# Patient Record
Sex: Male | Born: 1955 | Race: White | Hispanic: No | Marital: Married | Smoking: Never smoker
Health system: Southern US, Community
[De-identification: ages and names within clinical notes are randomized; demographics above are authoritative.]

## PROBLEM LIST (undated history)

## (undated) DIAGNOSIS — I10 Essential (primary) hypertension: Secondary | ICD-10-CM

## (undated) DIAGNOSIS — I2510 Atherosclerotic heart disease of native coronary artery without angina pectoris: Secondary | ICD-10-CM

## (undated) DIAGNOSIS — E119 Type 2 diabetes mellitus without complications: Secondary | ICD-10-CM

## (undated) DIAGNOSIS — I4891 Unspecified atrial fibrillation: Secondary | ICD-10-CM

## (undated) HISTORY — DX: Essential (primary) hypertension: I10

## (undated) HISTORY — PX: OTHER SURGICAL HISTORY: SHX169

## (undated) HISTORY — DX: Type 2 diabetes mellitus without complications: E11.9

## (undated) HISTORY — DX: Atherosclerotic heart disease of native coronary artery without angina pectoris: I25.10

## (undated) HISTORY — DX: Unspecified atrial fibrillation: I48.91

---

## 2005-03-20 ENCOUNTER — Emergency Department (HOSPITAL_COMMUNITY): Admission: EM | Admit: 2005-03-20 | Discharge: 2005-03-20 | Payer: Self-pay | Admitting: Emergency Medicine

## 2013-03-19 ENCOUNTER — Ambulatory Visit: Payer: Self-pay | Admitting: Cardiology

## 2013-03-29 ENCOUNTER — Encounter: Payer: Self-pay | Admitting: *Deleted

## 2013-03-29 ENCOUNTER — Encounter: Payer: Self-pay | Admitting: Interventional Cardiology

## 2013-03-29 DIAGNOSIS — E119 Type 2 diabetes mellitus without complications: Secondary | ICD-10-CM | POA: Insufficient documentation

## 2013-03-29 DIAGNOSIS — I2510 Atherosclerotic heart disease of native coronary artery without angina pectoris: Secondary | ICD-10-CM | POA: Insufficient documentation

## 2013-03-29 DIAGNOSIS — I10 Essential (primary) hypertension: Secondary | ICD-10-CM | POA: Insufficient documentation

## 2013-03-29 DIAGNOSIS — I4891 Unspecified atrial fibrillation: Secondary | ICD-10-CM | POA: Insufficient documentation

## 2013-03-30 ENCOUNTER — Encounter: Payer: Self-pay | Admitting: Interventional Cardiology

## 2013-03-30 ENCOUNTER — Ambulatory Visit (INDEPENDENT_AMBULATORY_CARE_PROVIDER_SITE_OTHER): Payer: 59 | Admitting: Interventional Cardiology

## 2013-03-30 ENCOUNTER — Encounter (INDEPENDENT_AMBULATORY_CARE_PROVIDER_SITE_OTHER): Payer: Self-pay

## 2013-03-30 VITALS — BP 134/78 | HR 71 | Ht 76.0 in | Wt 351.0 lb

## 2013-03-30 DIAGNOSIS — I10 Essential (primary) hypertension: Secondary | ICD-10-CM

## 2013-03-30 DIAGNOSIS — E669 Obesity, unspecified: Secondary | ICD-10-CM | POA: Insufficient documentation

## 2013-03-30 DIAGNOSIS — E782 Mixed hyperlipidemia: Secondary | ICD-10-CM | POA: Insufficient documentation

## 2013-03-30 DIAGNOSIS — I2510 Atherosclerotic heart disease of native coronary artery without angina pectoris: Secondary | ICD-10-CM

## 2013-03-30 DIAGNOSIS — I4891 Unspecified atrial fibrillation: Secondary | ICD-10-CM

## 2013-03-30 DIAGNOSIS — I4892 Unspecified atrial flutter: Secondary | ICD-10-CM | POA: Insufficient documentation

## 2013-03-30 NOTE — Progress Notes (Signed)
Patient ID: Stephen Marquez, male   DOB: [birth year], 57 y.o.   MRN: [id number]     Patient ID: Stephen Marquez MRN: [id number] DOB/AGE: Sep 16, 1955 57 y.o.   Referring Physician Dr. Tenny Craw   Reason for Consultation CAD  HPI: 57 y/o with h/o CAD and AFib.  He had CABG x 4 at age 57.  Since then he has had several heart caths and two stents placed.  He had a stent that closed and there were collaterals growing.  He has had AFib /Aflutteras well and as had an ablation.  He had chest pain with rapid heart rates.  He came close to passing out from HR above 200.  That was earlier in 2014.  AFib had started in 2008.    His last cath was 2 years ago and no stents were placed.  Both stents are closed.  2/4 bypasses were open.  SVGs were closed.  Gastroepiploic bypass was apparently open.  No cardiac sx.  Climbing stirs is his most strenuous activity. No cardiac sx with this.  He has no SHOB with multiple flights of stairs.  No recent NTG use.  No nausea, palpitations, syncope left arm pain.  His angina was DOE without pain.  He has never had a heart attack.  No CHF.    Father had CAD at an early age.    Current Outpatient Prescriptions  Medication Sig Dispense Refill  . aspirin 325 MG EC tablet Take 325 mg by mouth daily.      Marland Kitchen atorvastatin (LIPITOR) 20 MG tablet Take 1 tablet by mouth daily.      . clopidogrel (PLAVIX) 75 MG tablet Take 1 tablet by mouth daily.      . Famotidine 20 MG CHEW Chew by mouth 2 (two) times daily.      . Fenofibrate 150 MG CAPS Take 1 tablet by mouth daily.      . folic acid (FOLVITE) 800 MCG tablet Take 400 mcg by mouth daily.      . isosorbide mononitrate (IMDUR) 60 MG 24 hr tablet Take 1 tablet by mouth daily.      Demetra Shiner [employer] (ONE TOUCH DELICA LANCING DEV) MISC As directed      . losartan (COZAAR) 25 MG tablet Take 1 tablet by mouth daily.      . metFORMIN (GLUCOPHAGE) 1000 MG tablet Take 2 tablets by mouth 2 (two) times daily.      . metoprolol  (LOPRESSOR) 100 MG tablet Take 2 tablets by mouth 2 (two) times daily.      . Multiple Vitamins-Minerals (MULTIVITAMIN PO) Take by mouth daily.      . niacin 500 MG tablet Take 500 mg by mouth 3 (three) times daily with meals.      . nitroGLYCERIN (NITROSTAT) 0.4 MG SL tablet Place 0.4 mg under the tongue every 5 (five) minutes as needed for chest pain.      . Omega-3 Fatty Acids (FISH OIL) 1000 MG CAPS Take by mouth daily.      . ONE TOUCH ULTRA TEST test strip As directed      . ONETOUCH DELICA LANCETS FINE MISC As directed      . pioglitazone (ACTOS) 15 MG tablet Take 2 tablets by mouth daily.       No current facility-administered medications for this visit.   Past Medical History  Diagnosis Date  . CAD (coronary artery disease)   . HTN (hypertension)   . Diabetes   . A-fib '  Family History  Problem Relation Age of Onset  . Heart disease Father   . Dementia Mother   . Hypertension Mother     History   Social History  . Marital Status: Married    Spouse Name: N/A    Number of Children: N/A  . Years of Education: N/A   Occupational History  . Not on file.   Social History Main Topics  . Smoking status: Never Smoker   . Smokeless tobacco: Not on file  . Alcohol Use: Not on file  . Drug Use: Not on file  . Sexual Activity: Not on file   Other Topics Concern  . Not on file   Social History Narrative  . No narrative on file    Past Surgical History  Procedure Laterality Date  . Quadruple bypass    . Heart stent    . Heart ablation        (Not in a hospital admission)  Review of systems complete and found to be negative unless listed above .  No nausea, vomiting.  No fever chills, No focal weakness,  No palpitations.  Physical Exam: Filed Vitals:   03/30/13 1540  BP: 134/78  Pulse: 71    Weight: 351 lb (159.213 kg)  Physical exam:  [place]/AT EOMI No JVD, No carotid bruit RRR S1S2  No wheezing Soft. NT, nondistended Bilateral LE edema. No focal  motor or sensory deficits Normal affect  Labs:   No results found for this basename: WBC, HGB, HCT, MCV, PLT   No results found for this basename: NA, K, CL, CO2, BUN, CREATININE, CALCIUM, LABALBU, PROT, BILITOT, ALKPHOS, ALT, AST, GLUCOSE,  in the last 168 hours No results found for this basename: CKTOTAL, CKMB, CKMBINDEX, TROPONINI    No results found for this basename: CHOL   No results found for this basename: HDL   No results found for this basename: LDLCALC   No results found for this basename: TRIG   No results found for this basename: CHOLHDL   No results found for this basename: LDLDIRECT       EKG: NSR, NSST  ASSESSMENT AND PLAN:   CAD: Continue medical therapy. No anginal symptoms at this time. We'll try to get a CD of his most recent catheterization film which was done prior to moving [place]. We discussed switching metoprolol to carvedilol. This is fine with me. He is not sure why Dr. Tenny Craw was interested in this. He like to continue with the metoprolol for now.  Obesity: Stressed the importance of weight loss. Diet has improved.  Try to get to 30 minutes a day, 5 days a week of walking.  HTN: Controlled. Continue current medications.  AFib/AFlutter: Status post ablation. Not on anticoagulation at this time.  Hyperlipidemia: He has had increased TG for some time.  Will check lipids and electrolytes.  Signed:   Fredric Mare, MD, [facility] 03/30/2013, 4:08 PM

## 2013-03-30 NOTE — Patient Instructions (Signed)
Your physician recommends that you return for lab work on 04/02/13 for Cmet and Lipid.  Your physician wants you to follow-up in: 6 months with Dr. Eldridge Dace. You will receive a reminder letter in the mail two months in advance. If you don't receive a letter, please call our office to schedule the follow-up appointment.  We will obtain a CD of your cath films.

## 2013-04-02 ENCOUNTER — Other Ambulatory Visit: Payer: 59

## 2013-04-06 ENCOUNTER — Other Ambulatory Visit (INDEPENDENT_AMBULATORY_CARE_PROVIDER_SITE_OTHER): Payer: 59

## 2013-04-06 DIAGNOSIS — E782 Mixed hyperlipidemia: Secondary | ICD-10-CM

## 2013-04-06 LAB — LDL CHOLESTEROL, DIRECT: Direct LDL: 77 mg/dL

## 2013-04-06 LAB — COMPREHENSIVE METABOLIC PANEL
ALT: 30 U/L (ref 0–53)
AST: 25 U/L (ref 0–37)
Albumin: 4.2 g/dL (ref 3.5–5.2)
Alkaline Phosphatase: 71 U/L (ref 39–117)
BUN: 17 mg/dL (ref 6–23)
CO2: 27 mEq/L (ref 19–32)
Calcium: 9.7 mg/dL (ref 8.4–10.5)
Chloride: 103 mEq/L (ref 96–112)
Creatinine, Ser: 0.8 mg/dL (ref 0.4–1.5)
GFR: 111 mL/min (ref >60.00)
Glucose, Bld: 144 mg/dL — ABNORMAL HIGH (ref 70–99)
Potassium: 4.3 mEq/L (ref 3.5–5.1)
Sodium: 137 mEq/L (ref 135–145)
Total Bilirubin: 0.8 mg/dL (ref 0.3–1.2)
Total Protein: 6.9 g/dL (ref 6.0–8.3)

## 2013-04-06 LAB — LIPID PANEL
Cholesterol: 142 mg/dL (ref 0–200)
HDL: 30.1 mg/dL — ABNORMAL LOW (ref >39.00)
Total CHOL/HDL Ratio: 5
Triglycerides: 335 mg/dL — ABNORMAL HIGH (ref 0.0–149.0)
VLDL: 67 mg/dL — ABNORMAL HIGH (ref 0.0–40.0)

## 2013-04-09 ENCOUNTER — Other Ambulatory Visit: Payer: Self-pay | Admitting: Cardiology

## 2013-04-09 ENCOUNTER — Telehealth: Payer: Self-pay | Admitting: Cardiology

## 2013-04-09 DIAGNOSIS — E782 Mixed hyperlipidemia: Secondary | ICD-10-CM

## 2013-04-09 NOTE — Telephone Encounter (Signed)
Stephen Marquez, can you please look at pts most recent Lipid panel per Dr. Eldridge Dace.

## 2013-04-09 NOTE — Telephone Encounter (Signed)
LDL 77, non-HDL 112, HDL 30, TG 335, glucose 144  RF:  H/o CABG and multiple PCI, HTN, diabetes, age - LDL goal < 70, non-HDL < 100 Meds:  Lipitor 20 mg qd, Fenofibrate 150 mg qd, fish oil 1 g/d, niacin 500 mg - 3 per day. Patient recently moved here from [place]. Patient's LDL, non-HDL, and TG all need further reduction given his past medical history.   Will have patient increase lipitor and fish oil, and also make sure he is taking fibrate with largest meal of the day.  Would like to have NMR checked in 3 months to assess LDL-P number given CAD at young age and diabetes. Plan: 1.  Increase lipitor to 40 mg qd. 2.  Increase fish oil to 4 grams daily. 3.  Make sure he is taking fenofibrate with largest meal of the day which helps make medication work more effectively. 4.  Continue niacin. 5.  Check an NMR LipoProfile and hepatic panel in 3 months.  Have patient follow up with me in Lipid Clinic 2-3 days after blood work to discuss results and any possible changes. Please notify patient, update meds, set up labs, and set up a 30 minute Lipid Clinic appointment a few days later. Thanks.

## 2013-04-10 MED ORDER — FISH OIL 1000 MG PO CAPS: ORAL_CAPSULE | ORAL | Status: DC

## 2013-04-10 MED ORDER — ATORVASTATIN CALCIUM 40 MG PO TABS: ORAL_TABLET | ORAL | Status: DC

## 2013-04-10 NOTE — Telephone Encounter (Signed)
Pts wife notifed. Lab appt made for 07/09/13. To [facility], please make lipid clinc appt 2-3 days after lab appt and call to let pts wife Beth know. Thank you.

## 2013-04-10 NOTE — Telephone Encounter (Signed)
Pts wife notified. Pt is taking Niacin as stated on med list.

## 2013-04-10 NOTE — Telephone Encounter (Signed)
Understood.  Would like him to continue fish oil 4 capsules bid, increase lipitor to 40 mg qd, take fenofibrate with largest meal, and continue niacin as recommended.  Get blood work and keep appointment in March as recommended.  Have patient bring in medications to that appointment if possible.  Thanks.

## 2013-04-10 NOTE — Telephone Encounter (Signed)
Appointment 07/12/13 @ 10am

## 2013-04-10 NOTE — Telephone Encounter (Signed)
When I called ps wife to give her husbands lipid clinic appt she told me he has been taking fish 1,000 mg 4 cap in am and 4 cap in pm. Pt has been doing this for quite a while.

## 2013-05-17 ENCOUNTER — Telehealth: Payer: Self-pay | Admitting: Interventional Cardiology

## 2013-05-17 NOTE — Telephone Encounter (Signed)
Cath Report Rec From [facility] gave to Amy, [place] sent ROI up to  Cath Dept to have Put On CD and it will Get Mailed once CD is received give to Amy

## 2013-07-09 ENCOUNTER — Other Ambulatory Visit (INDEPENDENT_AMBULATORY_CARE_PROVIDER_SITE_OTHER): Payer: 59

## 2013-07-09 DIAGNOSIS — E782 Mixed hyperlipidemia: Secondary | ICD-10-CM

## 2013-07-09 LAB — HEPATIC FUNCTION PANEL
ALT: 27 U/L (ref 0–53)
AST: 22 U/L (ref 0–37)
Albumin: 4.1 g/dL (ref 3.5–5.2)
Alkaline Phosphatase: 73 U/L (ref 39–117)
Bilirubin, Direct: 0.1 mg/dL (ref 0.0–0.3)
Total Bilirubin: 0.8 mg/dL (ref 0.3–1.2)
Total Protein: 6.8 g/dL (ref 6.0–8.3)

## 2013-07-10 LAB — NMR LIPOPROFILE WITH LIPIDS
Cholesterol, Total: 138 mg/dL (ref <200)
HDL Particle Number: 26.9 umol/L — ABNORMAL LOW (ref >=30.5)
HDL Size: 9 nm — ABNORMAL LOW (ref >=9.2)
HDL-C: 27 mg/dL — ABNORMAL LOW (ref >=40)
LDL Particle Number: 980 nmol/L (ref <1000)
LDL Size: 19.5 nm — ABNORMAL LOW (ref >20.5)
LP-IR Score: 59 — ABNORMAL HIGH (ref <=45)
Large HDL-P: <1.3 umol/L — ABNORMAL LOW (ref >=4.8)
Large VLDL-P: 4.4 nmol/L — ABNORMAL HIGH (ref <=2.7)
Small LDL Particle Number: 862 nmol/L — ABNORMAL HIGH (ref <=527)
Triglycerides: 417 mg/dL — ABNORMAL HIGH (ref <150)
VLDL Size: 45.2 nm (ref <=46.6)

## 2013-07-12 ENCOUNTER — Ambulatory Visit: Payer: 59 | Admitting: Pharmacist

## 2013-07-13 ENCOUNTER — Encounter: Payer: Self-pay | Admitting: Cardiology

## 2013-07-13 ENCOUNTER — Telehealth: Payer: Self-pay | Admitting: Cardiology

## 2013-07-13 DIAGNOSIS — E782 Mixed hyperlipidemia: Secondary | ICD-10-CM

## 2013-07-13 MED ORDER — ICOSAPENT ETHYL 1 G PO CAPS: ORAL_CAPSULE | ORAL | Status: DC

## 2013-07-13 NOTE — Telephone Encounter (Signed)
Message copied by Alcario Drought on Fri Jul 13, 2013 11:27 AM ------      Message from: SMART, Maralyn Sago      Created: Wed Jul 11, 2013  5:21 PM       RF:  H/o CABG and multiple PCI, HTN, diabetes, age - LDL goal < 70, non-HDL < 100; LDL-P goal at least < 1000      Meds:  Lipitor 40 mg qd, Fenofibrate 150 mg qd, fish oil 6-8 g/d (OTC), niacin 500 mg - 3 per day.      Patient recently moved here from [place].      Patient had lipid clinic appointment scheduled, but cancelled it due to work schedule.      Despite his TG being elevated, his LDL-P number is actually very good at < 1000 nmol/L.  Would like to see LDL-P < 800 if possible, which will likely happen as his TG are driven down further.      Would like patient to change to a more potent fish oil.  Given his age can actually get prescription Vascepa (prescription fish oil that is more potent at reducing TG and LDL) for pretty cheap with coupon card (as low as $9 per month for most plans with coupon card).      Plan:      1.  D/C over the counter fish oil.      2.  Start Vascepa 1 g capsules - take 4 per day.  Use Vascepa coupon card from sample closet (as low as $9 per month).  This is high dose fish oil.      3.  Continue fenofibrate with largest meal of the day, niacin 500 mg- 3 daily, and lipitor 40 mg qd.      4.  Recheck NMR LipoProfile and hepatic panel in 4 months.  If he would prefer to sit down with me to discuss, you can make an appointment for 2-3 days later in lipid clinic.      Please notify patient, update meds, set up labs, and if he wants, set up an appointment.             ------

## 2013-07-13 NOTE — Telephone Encounter (Signed)
Pt notified, meds updated and sent in. Copay card and copy of labs mailed to pt at his request.

## 2013-07-16 ENCOUNTER — Telehealth: Payer: Self-pay | Admitting: *Deleted

## 2013-07-16 NOTE — Telephone Encounter (Signed)
PA to [employer] for vascepa

## 2013-07-26 ENCOUNTER — Telehealth: Payer: Self-pay | Admitting: Interventional Cardiology

## 2013-07-26 NOTE — Telephone Encounter (Signed)
New message     Stephen Marquez is not covered by ins company.  Is there something else he can get?  Pt got special card but it did not go thru.  Wife called several days ago but got no response.

## 2013-07-26 NOTE — Telephone Encounter (Signed)
Called [facility], VASCEPA was denied due to incomplete information when prior authorization was submitted, an appeal sent in today.

## 2013-07-26 NOTE — Telephone Encounter (Signed)
Spoke with Maudie Mercury in refills.  Patient has [employer]. She will do a prior authorization request for this medication.  Samples of medication at front desk.  Patient's wife informed that she will get a call back with authorization info and she will pick up the samples tomorrow.

## 2013-08-23 ENCOUNTER — Telehealth: Payer: Self-pay | Admitting: *Deleted

## 2013-08-23 NOTE — Telephone Encounter (Signed)
Previously denied PA for patients VASCEPA through [facility], an APPEAL was done and APPROVED through 08/10/2014. ID # [id number]

## 2013-08-27 ENCOUNTER — Other Ambulatory Visit: Payer: Self-pay | Admitting: Interventional Cardiology

## 2013-10-15 ENCOUNTER — Encounter: Payer: Self-pay | Admitting: Interventional Cardiology

## 2013-10-15 ENCOUNTER — Ambulatory Visit (INDEPENDENT_AMBULATORY_CARE_PROVIDER_SITE_OTHER): Payer: 59 | Admitting: Interventional Cardiology

## 2013-10-15 VITALS — BP 146/76 | HR 74 | Ht 76.0 in | Wt 336.0 lb

## 2013-10-15 DIAGNOSIS — E669 Obesity, unspecified: Secondary | ICD-10-CM

## 2013-10-15 DIAGNOSIS — E782 Mixed hyperlipidemia: Secondary | ICD-10-CM

## 2013-10-15 DIAGNOSIS — I2510 Atherosclerotic heart disease of native coronary artery without angina pectoris: Secondary | ICD-10-CM

## 2013-10-15 DIAGNOSIS — I10 Essential (primary) hypertension: Secondary | ICD-10-CM

## 2013-10-15 DIAGNOSIS — I4891 Unspecified atrial fibrillation: Secondary | ICD-10-CM

## 2013-10-15 NOTE — Progress Notes (Signed)
Patient ID: Stephen Marquez, male   DOB: [birth year], 58 y.o.   MRN: [id number] Patient ID: Stephen Marquez, male   DOB: 1955-12-24, 58 y.o.   MRN: [id number]     Patient ID: Stephen Marquez MRN: [id number] DOB/AGE: 07/18/1955 58 y.o.   Referring Physician Dr. Harrington Challenger   Reason for Consultation CAD  HPI: 58 y/o with h/o CAD and AFib.  He had CABG x 4 at age 58.  Since then he has had several heart caths and two stents placed.  He had a stent that closed and there were collaterals growing.  He has had AFib /Aflutteras well and as had an ablation.  He had chest pain with rapid heart rates.  He came close to passing out from HR above 200.  That was earlier in 2014.  AFib had started in 2008.  Ablation for AFib 2012; Atrial flutter ablation in 2104.  His last cath was in 2012 and no stents were placed.  Both stents are closed.  2/4 bypasses were open.  SVGs were closed.  Gastroepiploic bypass was apparently open.  No cardiac sx.  Climbing stairs is his most strenuous activity. No cardiac sx with this.  He has no SHOB with multiple flights of stairs.  No recent NTG use.  No nausea, palpitations, syncope left arm pain.  His angina was DOE without pain.  He has never had a heart attack.  No CHF.    Father had CAD at an early age.   Feels well since the last visit.  Lost 15 lbs since 12/14 through diet.  Getting used to diabetes.  Has not seen a nutritionist recently.   Current Outpatient Prescriptions  Medication Sig Dispense Refill  . aspirin 325 MG EC tablet Take 325 mg by mouth daily.      Marland Kitchen atorvastatin (LIPITOR) 40 MG tablet Take 1 tablet by mouth  daily  90 tablet  0  . Canagliflozin (INVOKANA PO) Take 100 mg by mouth.       . clopidogrel (PLAVIX) 75 MG tablet Take 1 tablet by mouth daily.      . Famotidine 20 MG CHEW Chew by mouth 2 (two) times daily.      . Fenofibrate 150 MG CAPS Take 1 tablet by mouth daily.      Stephen Marquez Ethyl (VASCEPA) 1 G CAPS 2 capsules twice a day   120 capsule  6  . isosorbide mononitrate (IMDUR) 60 MG 24 hr tablet Take 1 tablet by mouth daily.      Stephen Marquez Devices (ONE TOUCH DELICA LANCING DEV) MISC As directed      . losartan (COZAAR) 25 MG tablet Take 1 tablet by mouth daily.      . metFORMIN (GLUCOPHAGE) 1000 MG tablet Take 2 tablets by mouth 2 (two) times daily.      . metoprolol (LOPRESSOR) 100 MG tablet Take 1 tablet by mouth 2 (two) times daily.       . Multiple Vitamins-Minerals (MULTIVITAMIN PO) Take by mouth daily.      . niacin 500 MG tablet Take 500 mg by mouth 3 (three) times daily with meals.      . nitroGLYCERIN (NITROSTAT) 0.4 MG SL tablet Place 0.4 mg under the tongue every 5 (five) minutes as needed for chest pain.      . ONE TOUCH ULTRA TEST test strip As directed      . ONETOUCH DELICA LANCETS FINE MISC As directed      . pioglitazone (  ACTOS) 15 MG tablet Take 1 tablet by mouth daily.        No current facility-administered medications for this visit.   Past Medical History  Diagnosis Date  . CAD (coronary artery disease)   . HTN (hypertension)   . Diabetes   . A-fib '    Family History  Problem Relation Age of Onset  . Heart disease Father   . Dementia Mother   . Hypertension Mother     History   Social History  . Marital Status: Married    Spouse Name: N/A    Number of Children: N/A  . Years of Education: N/A   Occupational History  . Not on file.   Social History Main Topics  . Smoking status: Never Smoker   . Smokeless tobacco: Not on file  . Alcohol Use: Not on file  . Drug Use: Not on file  . Sexual Activity: Not on file   Other Topics Concern  . Not on file   Social History Narrative  . No narrative on file    Past Surgical History  Procedure Laterality Date  . Quadruple bypass    . Heart stent    . Heart ablation        (Not in a hospital admission)  Review of systems complete and found to be negative unless listed above .  No nausea, vomiting.  No fever chills, No  focal weakness,  No palpitations.  Physical Exam: Filed Vitals:   10/15/13 1555  BP: 146/76  Pulse: 74    Weight: 336 lb (152.409 kg)  Physical exam:  [place]/AT EOMI No JVD, No carotid bruit RRR S1S2  No wheezing Soft. NT, nondistended, obese Bilateral LE edema. No focal motor or sensory deficits Normal affect  Labs:   No results found for this basename: WBC,  HGB,  HCT,  MCV,  PLT   No results found for this basename: NA, K, CL, CO2, BUN, CREATININE, CALCIUM, LABALBU, PROT, BILITOT, ALKPHOS, ALT, AST, GLUCOSE,  in the last 168 hours No results found for this basename: CKTOTAL,  CKMB,  CKMBINDEX,  TROPONINI    Lab Results  Component Value Date   CHOL 142 04/06/2013   Lab Results  Component Value Date   HDL 30.10* 04/06/2013   Lab Results  Component Value Date   LDLCALC NOT CALC 07/09/2013   Lab Results  Component Value Date   TRIG 417* 07/09/2013   TRIG 335.0* 04/06/2013   Lab Results  Component Value Date   CHOLHDL 5 04/06/2013   Lab Results  Component Value Date   LDLDIRECT 77.0 04/06/2013       EKG: NSR, NSST  ASSESSMENT AND PLAN:   CAD: Continue medical therapy. No anginal symptoms at this time. We'll try to get a CD of his most recent catheterization film which was done prior to moving [place]. We discussed switching metoprolol to carvedilol. This is fine with me. He is not sure why Dr. Harrington Challenger was interested in this. He like to continue with the metoprolol for now.  Obesity: Stressed the importance of weight loss. Diet has improved.  Try to get to 30 minutes a day, 5 days a week of walking.  Offered meeting with nutritionist.  Would consider referral to diabetes coordinator.  He will check with his PCP .  HTN: Controlled. Continue current medications.  AFib/AFlutter: Status post ablation. Not on anticoagulation at this time.  Hyperlipidemia: He has had increased TG for some time.  Will check lipids  and electrolytes.  Check with PharmD if there are other  options.   Signed:   Mina Marble, MD, [facility] 10/15/2013, 4:08 PM

## 2013-10-15 NOTE — Patient Instructions (Signed)
Your physician recommends that you continue on your current medications as directed. Please refer to the Current Medication list given to you today.  Your physician wants you to follow-up in: 1 year with Dr. Varanasi. You will receive a reminder letter in the mail two months in advance. If you don't receive a letter, please call our office to schedule the follow-up appointment.  

## 2013-12-02 ENCOUNTER — Other Ambulatory Visit: Payer: Self-pay | Admitting: Interventional Cardiology

## 2014-03-18 ENCOUNTER — Other Ambulatory Visit: Payer: Self-pay

## 2014-03-18 DIAGNOSIS — E782 Mixed hyperlipidemia: Secondary | ICD-10-CM

## 2014-03-18 MED ORDER — ICOSAPENT ETHYL 1 G PO CAPS: ORAL_CAPSULE | ORAL | Status: AC

## 2014-05-13 ENCOUNTER — Other Ambulatory Visit: Payer: Self-pay | Admitting: Interventional Cardiology

## 2014-08-13 ENCOUNTER — Other Ambulatory Visit: Payer: Self-pay | Admitting: Interventional Cardiology

## 2014-09-09 ENCOUNTER — Telehealth: Payer: Self-pay | Admitting: Interventional Cardiology

## 2014-09-09 NOTE — Telephone Encounter (Signed)
New Message      Pt's wife called to schedule pt's recall w/ Dr. Irish Lack and wants to know if he is also due for lab work. There are no orders for labs in Epic but she wants to check and make sure. Please call back and advise.

## 2014-09-09 NOTE — Telephone Encounter (Signed)
Spoke with wife and informed her that I would route this information to Dr. Irish Lack for review and advisement on which labs he would like drawn. Wife verbalized understanding.

## 2014-09-10 NOTE — Telephone Encounter (Signed)
He is followed by lipid clinic.  Please check with them. THanks,

## 2014-09-11 NOTE — Telephone Encounter (Signed)
Pt has never been seen in lipid clinic, Ysidro Evert gave recommendations for medication management in December 2014, but pt has not ever been established in lipid clinic.  Please advise if Dr Irish Lack wants pt to be enrolled, but he has not been followed here formally in our clinic.  Gay Filler is out of the office all week, will not return until Tuesday 09/17/14.

## 2014-09-11 NOTE — Telephone Encounter (Signed)
Will forward to Elberta Leatherwood, PharmD for review on which labs to order

## 2014-09-11 NOTE — Telephone Encounter (Signed)
Will forward to Dr. Varanasi for review and advisement.  

## 2014-09-18 NOTE — Telephone Encounter (Signed)
Ok to enroll in lipid clinic

## 2014-09-18 NOTE — Telephone Encounter (Signed)
Spoke with pt's wife and informed her that Dr. Irish Lack would like for her husband to enroll in our lipid clinic. Wife in agreement with this plan.

## 2014-11-05 ENCOUNTER — Ambulatory Visit: Payer: Self-pay | Admitting: Pharmacist

## 2014-11-07 ENCOUNTER — Other Ambulatory Visit: Payer: Self-pay | Admitting: Pharmacist

## 2014-11-07 DIAGNOSIS — E782 Mixed hyperlipidemia: Secondary | ICD-10-CM

## 2014-11-08 ENCOUNTER — Ambulatory Visit: Payer: Self-pay | Admitting: Pharmacist

## 2014-11-11 ENCOUNTER — Other Ambulatory Visit (INDEPENDENT_AMBULATORY_CARE_PROVIDER_SITE_OTHER): Payer: 59 | Admitting: *Deleted

## 2014-11-11 DIAGNOSIS — E782 Mixed hyperlipidemia: Secondary | ICD-10-CM

## 2014-11-11 LAB — HEPATIC FUNCTION PANEL
ALT: 24 U/L (ref 0–53)
AST: 18 U/L (ref 0–37)
Albumin: 4.1 g/dL (ref 3.5–5.2)
Alkaline Phosphatase: 83 U/L (ref 39–117)
BILIRUBIN TOTAL: 0.8 mg/dL (ref 0.2–1.2)
Bilirubin, Direct: 0.2 mg/dL (ref 0.0–0.3)
Total Protein: 6.5 g/dL (ref 6.0–8.3)

## 2014-11-11 LAB — LIPID PANEL
Cholesterol: 143 mg/dL (ref 0–200)
HDL: 32.3 mg/dL — ABNORMAL LOW (ref >39.00)
NonHDL: 110.7
Total CHOL/HDL Ratio: 4
Triglycerides: 214 mg/dL — ABNORMAL HIGH (ref 0.0–149.0)
VLDL: 42.8 mg/dL — ABNORMAL HIGH (ref 0.0–40.0)

## 2014-11-11 LAB — LDL CHOLESTEROL, DIRECT: Direct LDL: 70 mg/dL

## 2014-11-11 NOTE — Addendum Note (Signed)
Addended by: Merel Santoli K on: 11/11/2014 07:33 AM   Modules accepted: Orders  

## 2014-11-12 ENCOUNTER — Encounter: Payer: Self-pay | Admitting: Interventional Cardiology

## 2014-11-12 ENCOUNTER — Ambulatory Visit (INDEPENDENT_AMBULATORY_CARE_PROVIDER_SITE_OTHER): Payer: 59 | Admitting: Interventional Cardiology

## 2014-11-12 VITALS — BP 124/64 | HR 59 | Ht 76.0 in | Wt 333.4 lb

## 2014-11-12 DIAGNOSIS — E669 Obesity, unspecified: Secondary | ICD-10-CM

## 2014-11-12 DIAGNOSIS — E782 Mixed hyperlipidemia: Secondary | ICD-10-CM | POA: Diagnosis not present

## 2014-11-12 DIAGNOSIS — I10 Essential (primary) hypertension: Secondary | ICD-10-CM | POA: Diagnosis not present

## 2014-11-12 DIAGNOSIS — I4891 Unspecified atrial fibrillation: Secondary | ICD-10-CM

## 2014-11-12 DIAGNOSIS — I2510 Atherosclerotic heart disease of native coronary artery without angina pectoris: Secondary | ICD-10-CM

## 2014-11-12 MED ORDER — NITROGLYCERIN 0.4 MG SL SUBL: 0.4000 mg | SUBLINGUAL_TABLET | SUBLINGUAL | Status: DC | PRN

## 2014-11-12 NOTE — Progress Notes (Signed)
Patient ID: Stephen Marquez, male   DOB: [birth year], 59 y.o.   MRN: [id number]     Cardiology Office Note   Date:  11/12/2014   ID:  Stephen Marquez, DOB 25-Apr-1955, MRN [id number]  PCP:   Melinda Crutch, MD    No chief complaint on file.  Follow-up coronary artery disease  Wt Readings from Last 3 Encounters:  11/12/14 333 lb 6.4 oz (151.229 kg)  10/15/13 336 lb (152.409 kg)  03/30/13 351 lb (159.213 kg)       History of Present Illness: Stephen Marquez is a 59 y.o. male  with h/o CAD and AFib. He had CABG x 4 at age 66. Since then he has had several heart caths and two stents placed. He had a stent that closed and there were collaterals growing. He has had AFib /Aflutteras well and as had an ablation. He had chest pain with rapid heart rates. He came close to passing out from HR above 200. That was earlier in 2014. AFib had started in 2008. Ablation for AFib 2012; Atrial flutter ablation in 2014.  His last cath was in 2012 and no stents were placed. Both stents are closed. 2/4 bypasses were open. SVGs were closed. Gastroepiploic bypass was apparently open. No cardiac sx. Climbing stairs is his most strenuous activity. No cardiac sx with this. He has no SHOB with multiple flights of stairs. No recent NTG use. No nausea, palpitations, syncope left arm pain. His angina was DOE without pain. He has never had a heart attack. No CHF.   Since the last visit in 2015, he has increased walking.  He is doing 10K steps per day.  Most of this is done at work.  He ha some heel problems that limit his exercise.     Past Medical History  Diagnosis Date  . CAD (coronary artery disease)   . HTN (hypertension)   . Diabetes   . A-fib '    Past Surgical History  Procedure Laterality Date  . Quadruple bypass    . Heart stent    . Heart ablation       Current Outpatient Prescriptions  Medication Sig Dispense Refill  . aspirin 325 MG EC tablet Take 325 mg by  mouth daily.    Marland Kitchen atorvastatin (LIPITOR) 40 MG tablet Take 1 tablet by mouth  daily 90 tablet 0  . Canagliflozin (INVOKANA PO) Take 100 mg by mouth.     . clopidogrel (PLAVIX) 75 MG tablet Take 1 tablet by mouth daily.    . Famotidine 20 MG CHEW Chew by mouth 2 (two) times daily.    . Fenofibrate 150 MG CAPS Take 1 tablet by mouth daily.    [name] Ethyl (VASCEPA) 1 G CAPS 2 capsules twice a day 120 capsule 6  . isosorbide mononitrate (IMDUR) 60 MG 24 hr tablet Take 1 tablet by mouth daily.    Elmore Guise Devices (ONE TOUCH DELICA LANCING DEV) MISC As directed    . losartan (COZAAR) 25 MG tablet Take 1 tablet by mouth daily.    . metFORMIN (GLUCOPHAGE) 1000 MG tablet Take 2 tablets by mouth 2 (two) times daily.    . metoprolol (LOPRESSOR) 100 MG tablet Take 1 tablet by mouth 2 (two) times daily.     . Multiple Vitamins-Minerals (MULTIVITAMIN PO) Take by mouth daily.    . niacin 500 MG tablet Take 1,500 mg by mouth 2 (two) times daily with a meal.     .  nitroGLYCERIN (NITROSTAT) 0.4 MG SL tablet Place 0.4 mg under the tongue every 5 (five) minutes as needed for chest pain.    . ONE TOUCH ULTRA TEST test strip As directed    . ONETOUCH DELICA LANCETS FINE MISC As directed    . pioglitazone (ACTOS) 15 MG tablet Take 1 tablet by mouth daily.      No current facility-administered medications for this visit.    Allergies:   Penicillins and Sulfur    Social History:  The patient  reports that he has never smoked. He does not have any smokeless tobacco history on file.   Family History:  The patient's family history includes Dementia in his mother; Heart disease in his father; Hypertension in his mother.    ROS:  Please see the history of present illness.   Otherwise, review of systems are positive for heel pain, episode of cellulitis-resolved with Antibiotics.   All other systems are reviewed and negative.    PHYSICAL EXAM: VS:  BP 124/64 mmHg  Pulse 59  Ht 6\' 4"  (1.93 m)  Wt 333 lb  6.4 oz (151.229 kg)  BMI 40.60 kg/m2 , BMI Body mass index is 40.6 kg/(m^2). GEN: Well nourished, well developed, in no acute distress HEENT: normal Neck: no JVD, carotid bruits, or masses Cardiac: RRR; no murmurs, rubs, or gallops,no edema  Respiratory:  clear to auscultation bilaterally, normal work of breathing GI: soft, nontender, nondistended, + BS MS: no deformity or atrophy Skin: warm and dry, no rash, scars on right leg Neuro:  Strength and sensation are intact Psych: euthymic mood, full affect   EKG:   The ekg ordered today demonstrates NSR, NSST   Recent Labs: 11/11/2014: ALT 24   Lipid Panel    Component Value Date/Time   CHOL 143 11/11/2014 0733   CHOL 138 07/09/2013 0837   TRIG 214.0* 11/11/2014 0733   TRIG 417* 07/09/2013 0837   HDL 32.30* 11/11/2014 0733   HDL 27* 07/09/2013 0837   CHOLHDL 4 11/11/2014 0733   VLDL 42.8* 11/11/2014 0733   LDLCALC NOT CALC 07/09/2013 0837   LDLDIRECT 70.0 11/11/2014 0733     Other studies Reviewed: Additional studies/ records that were reviewed today with results demonstrating: prior cath results.   ASSESSMENT AND PLAN:  1. CAD: Decrease aspirin to 81 mg daily.  Refill SL NTG.  Continue medical therapy. No anginal symptoms at this time. 2. Obesity: Stressed the importance of weight loss. Diet has improved. Try to get to 30 minutes a day, 5 days a week of walking. He has maintained a 50 lb weight loss.  He is trying to get to less than 300 lbs. 3. HTN: Controlled.  Continue current meds. 4. AFib/AFlutter: s/p ablations in 2012 and 2014. 5. Hyperlipidemia: Most recent lipid check showed that his LDL is controlled. His triglycerides were significantly improved compared to 2015. He has improved his diet. Continue lifestyle modifications.   Current medicines are reviewed at length with the patient today.  The patient concerns regarding his medicines were addressed.  The following changes have been made:  Refill  nitroglycerin  Labs/ tests ordered today include: Check lipids in one year along with liver function tests  No orders of the defined types were placed in this encounter.    Recommend 150 minutes/week of aerobic exercise Low fat, low carb, high fiber diet recommended  Disposition:   FU in one year   Teresita Madura., MD  11/12/2014 4:59 PM    [facility] [place], [place], [place]  [address] Phone: [phone]; Fax: [phone]

## 2014-11-12 NOTE — Patient Instructions (Signed)
Medication Instructions:  Same-no change  Labwork: Lipids and LFT's in 1 year. Please do not eat or drink after midnight the night before labs are drawn.  Testing/Procedures: None  Follow-Up: Your physician wants you to follow-up in: 1 year. You will receive a reminder letter in the mail two months in advance. If you don't receive a letter, please call our office to schedule the follow-up appointment.

## 2014-12-04 ENCOUNTER — Other Ambulatory Visit: Payer: Self-pay | Admitting: Interventional Cardiology

## 2015-01-05 ENCOUNTER — Other Ambulatory Visit: Payer: Self-pay | Admitting: Interventional Cardiology

## 2015-08-03 ENCOUNTER — Other Ambulatory Visit: Payer: Self-pay | Admitting: Interventional Cardiology

## 2015-10-18 ENCOUNTER — Other Ambulatory Visit: Payer: Self-pay | Admitting: Interventional Cardiology

## 2015-10-20 ENCOUNTER — Other Ambulatory Visit (INDEPENDENT_AMBULATORY_CARE_PROVIDER_SITE_OTHER): Payer: 59 | Admitting: *Deleted

## 2015-10-20 DIAGNOSIS — I2510 Atherosclerotic heart disease of native coronary artery without angina pectoris: Secondary | ICD-10-CM

## 2015-10-20 DIAGNOSIS — I2583 Coronary atherosclerosis due to lipid rich plaque: Secondary | ICD-10-CM

## 2015-10-20 DIAGNOSIS — I483 Typical atrial flutter: Secondary | ICD-10-CM | POA: Diagnosis not present

## 2015-10-20 DIAGNOSIS — E782 Mixed hyperlipidemia: Secondary | ICD-10-CM | POA: Diagnosis not present

## 2015-10-20 DIAGNOSIS — I10 Essential (primary) hypertension: Secondary | ICD-10-CM | POA: Diagnosis not present

## 2015-10-20 LAB — HEPATIC FUNCTION PANEL
ALBUMIN: 4.1 g/dL (ref 3.6–5.1)
ALK PHOS: 81 U/L (ref 40–115)
ALT: 41 U/L (ref 9–46)
AST: 37 U/L — AB (ref 10–35)
Bilirubin, Direct: 0.3 mg/dL — ABNORMAL HIGH (ref <=0.2)
Indirect Bilirubin: 0.8 mg/dL (ref 0.2–1.2)
Total Bilirubin: 1.1 mg/dL (ref 0.2–1.2)
Total Protein: 6 g/dL — ABNORMAL LOW (ref 6.1–8.1)

## 2015-10-20 LAB — LIPID PANEL
Cholesterol: 111 mg/dL — ABNORMAL LOW (ref 125–200)
HDL: 28 mg/dL — AB (ref >=40)
LDL Cholesterol: 43 mg/dL (ref <130)
Total CHOL/HDL Ratio: 4 Ratio (ref <=5.0)
Triglycerides: 201 mg/dL — ABNORMAL HIGH (ref <150)
VLDL: 40 mg/dL — ABNORMAL HIGH (ref <30)

## 2015-10-20 NOTE — Addendum Note (Signed)
Addended by: Eulis Foster on: 10/20/2015 07:40 AM   Modules accepted: Orders

## 2015-11-20 ENCOUNTER — Encounter (INDEPENDENT_AMBULATORY_CARE_PROVIDER_SITE_OTHER): Payer: Self-pay

## 2015-11-20 ENCOUNTER — Ambulatory Visit (INDEPENDENT_AMBULATORY_CARE_PROVIDER_SITE_OTHER): Payer: 59 | Admitting: Interventional Cardiology

## 2015-11-20 ENCOUNTER — Encounter: Payer: Self-pay | Admitting: Interventional Cardiology

## 2015-11-20 VITALS — BP 120/60 | HR 72 | Ht 75.0 in | Wt 297.0 lb

## 2015-11-20 DIAGNOSIS — E785 Hyperlipidemia, unspecified: Secondary | ICD-10-CM

## 2015-11-20 DIAGNOSIS — I2510 Atherosclerotic heart disease of native coronary artery without angina pectoris: Secondary | ICD-10-CM | POA: Diagnosis not present

## 2015-11-20 DIAGNOSIS — I483 Typical atrial flutter: Secondary | ICD-10-CM | POA: Diagnosis not present

## 2015-11-20 DIAGNOSIS — I10 Essential (primary) hypertension: Secondary | ICD-10-CM

## 2015-11-20 DIAGNOSIS — I2583 Coronary atherosclerosis due to lipid rich plaque: Principal | ICD-10-CM

## 2015-11-20 MED ORDER — NITROGLYCERIN 0.4 MG SL SUBL: 0.4000 mg | SUBLINGUAL_TABLET | SUBLINGUAL | 3 refills | Status: AC | PRN

## 2015-11-20 NOTE — Progress Notes (Signed)
Patient ID: Stephen Marquez, male   DOB: 02-25-1956, 60 y.o.   MRN: [id number]     Cardiology Office Note   Date:  11/20/2015   ID:  COLLIER KIRCHBERG, DOB 1955/12/18, MRN [id number]  PCP:   Melinda Crutch, MD    No chief complaint on file.  Follow-up coronary artery disease  Wt Readings from Last 3 Encounters:  11/20/15 297 lb (134.7 kg)  11/12/14 (!) 333 lb 6.4 oz (151.2 kg)  10/15/13 (!) 336 lb (152.4 kg)       History of Present Illness: Stephen Marquez is a 60 y.o. male  with h/o CAD and AFib. He had CABG x 4 at age 46. Since then he has had several Stephen caths and two stents placed. He had a stent that closed and there were collaterals growing. He has had AFib /Aflutteras well and as had an ablation. He had chest pain with rapid Stephen rates. He came close to passing out from HR above 200. That was earlier in 2014. AFib had started in 2008. Ablation for AFib 2012; Atrial flutter ablation in 2014.  His last cath was in 2012 and no stents were placed. Both stents are closed. 2/4 bypasses were open. SVGs were closed. Gastroepiploic bypass was apparently open. No cardiac sx. Climbing stairs is his most strenuous activity. No cardiac sx with this. He has no SHOB with multiple flights of stairs. No recent NTG use. No nausea, palpitations, syncope left arm pain. His angina was DOE without pain. He has never had a Stephen attack. No CHF.   Since the last visit in 2016, he continues walking.  He is doing 10K steps per day most days.  Most of this is done at work.  He has some heel problems that limit his exercise.    He has lost 36 lbs. Since his last visit in 2016. This has been through diet control.  He has not done any more exercise and walking. No symptoms like his prior arrhythmia. No anginal symptoms either. He feels much better since his weight loss. He also notes that his triglycerides are lower now than they have been for years on his most recent  check.    Past Medical History:  Diagnosis Date  . A-fib [facility]) '  . CAD (coronary artery disease)   . Diabetes ([place])   . HTN (hypertension)     Past Surgical History:  Procedure Laterality Date  . Stephen ablation    . Stephen stent    . quadruple bypass       Current Outpatient Prescriptions  Medication Sig Dispense Refill  . aspirin 81 MG tablet Take 81 mg by mouth daily.    Marland Kitchen atorvastatin (LIPITOR) 40 MG tablet Take 1 tablet by mouth  daily 90 tablet 0  . Canagliflozin (INVOKANA PO) Take 100 mg by mouth.     . clopidogrel (PLAVIX) 75 MG tablet Take 1 tablet by mouth daily.    . Famotidine 20 MG CHEW Chew by mouth 2 (two) times daily.    . Fenofibrate 150 MG CAPS Take 1 tablet by mouth daily.    [name] Ethyl (VASCEPA) 1 G CAPS 2 capsules twice a day 120 capsule 6  . isosorbide mononitrate (IMDUR) 60 MG 24 hr tablet Take 1 tablet by mouth daily.    Elmore Guise Devices (ONE TOUCH DELICA LANCING DEV) MISC As directed    . losartan (COZAAR) 25 MG tablet Take 1 tablet by mouth daily.    Marland Kitchen  metFORMIN (GLUCOPHAGE) 1000 MG tablet Take 2 tablets by mouth 2 (two) times daily.    . metoprolol (LOPRESSOR) 100 MG tablet Take 1 tablet by mouth 2 (two) times daily.     . Multiple Vitamins-Minerals (MULTIVITAMIN PO) Take by mouth daily.    . niacin 500 MG tablet Take 1,500 mg by mouth 2 (two) times daily with a meal.     . nitroGLYCERIN (NITROSTAT) 0.4 MG SL tablet Place 0.4 mg under the tongue every 5 (five) minutes as needed for chest pain (3 DOSES MAX).    . ONE TOUCH ULTRA TEST test strip As directed    . ONETOUCH DELICA LANCETS FINE MISC As directed    . pioglitazone (ACTOS) 15 MG tablet Take 1 tablet by mouth daily.      No current facility-administered medications for this visit.     Allergies:   Penicillins and Sulfur    Social History:  The patient  reports that he has never smoked. He does not have any smokeless tobacco history on file.   Family History:  The patient's  family history includes Stephen Marquez; Stephen Marquez; Hypertension in his Marquez.    ROS:  Please see the history of present illness.   Otherwise, review of systems are positive for heel pain, episode of cellulitis-resolved with Antibiotics.   All other systems are reviewed and negative.    PHYSICAL EXAM: VS:  BP 120/60   Pulse 72   Ht 6\' 3"  (1.905 m)   Wt 297 lb (134.7 kg)   BMI 37.12 kg/m  , BMI Body mass index is 37.12 kg/m. GEN: Well nourished, well developed, in no acute distress  HEENT: normal  Neck: no JVD, carotid bruits, or masses Cardiac: RRR; no murmurs, rubs, or gallops,no edema  Respiratory:  clear to auscultation bilaterally, normal work of breathing GI: soft, nontender, nondistended, + BS MS: no deformity or atrophy  Skin: warm and dry, no rash, scars on right leg Neuro:  Strength and sensation are intact Psych: euthymic mood, full affect   EKG:   The ekg ordered today demonstrates NSR, NSST   Recent Labs: 10/20/2015: ALT 41   Lipid Panel    Component Value Date/Time   CHOL 111 (L) 10/20/2015 0740   CHOL 138 07/09/2013 0837   TRIG 201 (H) 10/20/2015 0740   TRIG 417 (H) 07/09/2013 0837   HDL 28 (L) 10/20/2015 0740   HDL 27 (L) 07/09/2013 0837   CHOLHDL 4.0 10/20/2015 0740   VLDL 40 (H) 10/20/2015 0740   LDLCALC 43 10/20/2015 0740   LDLCALC NOT CALC 07/09/2013 0837   LDLDIRECT 70.0 11/11/2014 0733     Other studies Reviewed: Additional studies/ records that were reviewed today with results demonstrating: prior cath results.   ASSESSMENT AND PLAN:  1. CAD: Decreased I told him that we would get aspirin to 81 mg daily.  Refill SL NTG.  Continue medical therapy. No anginal symptoms at this time. 2. Obesity: Stressed the importance of weight loss. Diet has improved. Try to get to 30 minutes a day, 5 days a week of walking. He has maintained an 85 lb weight loss and is less than 300 lbs. 3. HTN: Controlled.  Continue current  meds. 4. AFib/AFlutter: s/p ablations in 2012 and 2014. 5. Hyperlipidemia: Most recent lipid check showed that his LDL is controlled. His triglycerides were significantly improved compared to 2015. He has improved his diet. Continue lifestyle modifications.   Current medicines are reviewed  at length with the patient today.  The patient concerns regarding his medicines were addressed.  The following changes have been made:  Refill nitroglycerin  Labs/ tests ordered today include: Check lipids in one year along with liver function tests  No orders of the defined types were placed in this encounter.   Recommend 150 minutes/week of aerobic exercise Low fat, low carb, high fiber diet recommended  Disposition:   FU in one year   Signed, Larae Grooms, MD  11/20/2015 4:25 PM    [facility] [place], [place], [place]  [address] Phone: [phone]; Fax: [phone]

## 2015-11-20 NOTE — Patient Instructions (Signed)
**Note De-identified Stephen Marquez Obfuscation** Medication Instructions:  Same-no changes  Labwork: None  Testing/Procedures: None  Follow-Up: Your physician wants you to follow-up in: 1 year. You will receive a reminder letter in the mail two months in advance. If you don't receive a letter, please call our office to schedule the follow-up appointment.      If you need a refill on your cardiac medications before your next appointment, please call your pharmacy.   

## 2015-12-10 ENCOUNTER — Other Ambulatory Visit: Payer: Self-pay | Admitting: Gastroenterology

## 2015-12-10 DIAGNOSIS — D696 Thrombocytopenia, unspecified: Secondary | ICD-10-CM

## 2015-12-17 ENCOUNTER — Ambulatory Visit
Admission: RE | Admit: 2015-12-17 | Discharge: 2015-12-17 | Disposition: A | Discharge status: Home / Self Care | Payer: 59 | Source: Ambulatory Visit | Attending: Gastroenterology | Admitting: Gastroenterology

## 2015-12-17 DIAGNOSIS — D696 Thrombocytopenia, unspecified: Secondary | ICD-10-CM

## 2016-01-01 ENCOUNTER — Telehealth: Payer: Self-pay

## 2016-01-01 NOTE — Telephone Encounter (Signed)
**Note De-Identified Rosealyn Little Obfuscation** The pt is scheduled to have an endoscopy on 01/27/16 with Dr Berton Mount at [facility]. They are requesting that the pt stop taking Plavix 5 days prior to procedure. Also, according to our records, the pt is taking Asa 81 mg daily.  Will he need to stop taking prior to procedure as well?   Please advise.

## 2016-01-02 ENCOUNTER — Other Ambulatory Visit: Payer: Self-pay | Admitting: Interventional Cardiology

## 2016-01-02 NOTE — Telephone Encounter (Signed)
OK to stop Plavix 5 days prior to proceudre.  It is up to the GI MD if he has to stop aspirin.  If he does, he can also stop this 5 days prior to procedure.

## 2016-01-05 NOTE — Telephone Encounter (Signed)
**Note De-Identified Stephen Marquez Obfuscation** This message has been faxed back to [facility] at [phone]. I did receive confirmation that the fax was successful.

## 2016-01-13 ENCOUNTER — Other Ambulatory Visit: Payer: Self-pay | Admitting: Interventional Cardiology

## 2016-03-30 ENCOUNTER — Other Ambulatory Visit: Payer: Self-pay | Admitting: Gastroenterology

## 2016-03-30 DIAGNOSIS — K7469 Other cirrhosis of liver: Secondary | ICD-10-CM

## 2016-03-30 DIAGNOSIS — R188 Other ascites: Secondary | ICD-10-CM

## 2016-04-02 ENCOUNTER — Other Ambulatory Visit: Payer: 59

## 2016-04-08 ENCOUNTER — Ambulatory Visit
Admission: RE | Admit: 2016-04-08 | Discharge: 2016-04-08 | Disposition: A | Discharge status: Home / Self Care | Payer: 59 | Source: Ambulatory Visit | Attending: Gastroenterology | Admitting: Gastroenterology

## 2016-04-08 DIAGNOSIS — K7469 Other cirrhosis of liver: Secondary | ICD-10-CM

## 2016-04-08 DIAGNOSIS — R188 Other ascites: Secondary | ICD-10-CM

## 2016-05-04 ENCOUNTER — Other Ambulatory Visit (HOSPITAL_COMMUNITY): Payer: Self-pay | Admitting: Gastroenterology

## 2016-05-04 DIAGNOSIS — R188 Other ascites: Principal | ICD-10-CM

## 2016-05-04 DIAGNOSIS — K7460 Unspecified cirrhosis of liver: Secondary | ICD-10-CM

## 2016-05-06 ENCOUNTER — Ambulatory Visit (HOSPITAL_COMMUNITY): Admission: RE | Admit: 2016-05-06 | Payer: 59 | Source: Ambulatory Visit

## 2016-05-07 ENCOUNTER — Telehealth: Payer: Self-pay | Admitting: Interventional Cardiology

## 2016-05-07 NOTE — Telephone Encounter (Signed)
No further cardiac testing needed before paracentesis.  OK to hold Plavix 5 days prior to procedure.  Resume plavix post procedure.

## 2016-05-07 NOTE — Telephone Encounter (Signed)
Spoke with operator at [employer]  She will send message to [facility] to let her know that we did receive clearance and that I am sending message to Dr. Irish Lack for review and advisement.    Request for surgical clearance:  1. What type of surgery is being performed? Paracentesis   2. When is this surgery scheduled? 05/11/16   3. Are there any medications that need to be held prior to surgery and how long? Plavix   4. Name of physician performing surgery? Dr. Alessandra Bevels   5. What is your office phone and fax number? Fax [phone]  Ph [phone]

## 2016-05-07 NOTE — Telephone Encounter (Signed)
Faxed to requesting office. 

## 2016-05-07 NOTE — Telephone Encounter (Signed)
Follow Up:    She wants to know if the surgical clearance she faxed over on 05-04-16 was received? If so,she needs this back asap. Pt is scheduled for surgery  this Tuesday(05-11-16.

## 2016-05-11 ENCOUNTER — Ambulatory Visit (HOSPITAL_COMMUNITY)
Admission: RE | Admit: 2016-05-11 | Discharge: 2016-05-11 | Disposition: A | Discharge status: Home / Self Care | Payer: 59 | Source: Ambulatory Visit | Attending: Gastroenterology | Admitting: Gastroenterology

## 2016-05-11 DIAGNOSIS — R188 Other ascites: Secondary | ICD-10-CM | POA: Insufficient documentation

## 2016-05-11 DIAGNOSIS — K7460 Unspecified cirrhosis of liver: Secondary | ICD-10-CM | POA: Diagnosis not present

## 2016-05-11 LAB — GRAM STAIN

## 2016-05-11 MED ORDER — ALBUMIN HUMAN 25 % IV SOLN
50.0000 g | Freq: Once | INTRAVENOUS | Status: AC
  Administered 2016-05-11: 50 g via INTRAVENOUS
  Filled 2016-05-11: qty 200

## 2016-05-11 MED ORDER — LIDOCAINE HCL 1 % IJ SOLN
INTRAMUSCULAR | Status: AC
  Filled 2016-05-11: qty 20

## 2016-05-11 NOTE — Procedures (Signed)
Ultrasound-guided diagnostic and therapeutic paracentesis performed yielding 5 liters  (which was his max) of yellow colored fluid. No immediate complications.  Tai Syfert E 12:10 PM 05/11/2016

## 2016-05-16 LAB — CULTURE, BODY FLUID W GRAM STAIN -BOTTLE: Culture: NO GROWTH

## 2016-05-18 ENCOUNTER — Encounter (HOSPITAL_COMMUNITY): Payer: Self-pay

## 2016-05-18 ENCOUNTER — Ambulatory Visit (HOSPITAL_COMMUNITY)
Admission: RE | Admit: 2016-05-18 | Discharge: 2016-05-18 | Disposition: A | Discharge status: Home / Self Care | Payer: 59 | Source: Ambulatory Visit | Attending: Gastroenterology | Admitting: Gastroenterology

## 2016-05-18 DIAGNOSIS — J90 Pleural effusion, not elsewhere classified: Secondary | ICD-10-CM | POA: Insufficient documentation

## 2016-05-18 DIAGNOSIS — K7460 Unspecified cirrhosis of liver: Secondary | ICD-10-CM | POA: Insufficient documentation

## 2016-05-18 DIAGNOSIS — K766 Portal hypertension: Secondary | ICD-10-CM | POA: Diagnosis not present

## 2016-05-18 DIAGNOSIS — I8510 Secondary esophageal varices without bleeding: Secondary | ICD-10-CM | POA: Insufficient documentation

## 2016-05-18 DIAGNOSIS — R161 Splenomegaly, not elsewhere classified: Secondary | ICD-10-CM | POA: Diagnosis not present

## 2016-05-18 DIAGNOSIS — R188 Other ascites: Secondary | ICD-10-CM | POA: Insufficient documentation

## 2016-05-18 LAB — POCT I-STAT CREATININE: CREATININE: 0.5 mg/dL — AB (ref 0.61–1.24)

## 2016-05-18 MED ORDER — IOPAMIDOL (ISOVUE-300) INJECTION 61%
INTRAVENOUS | Status: DC
Start: 2016-05-18 — End: 2016-05-19
  Filled 2016-05-18: qty 100

## 2016-05-18 MED ORDER — IOPAMIDOL (ISOVUE-300) INJECTION 61%
100.0000 mL | Freq: Once | INTRAVENOUS | Status: AC | PRN
  Administered 2016-05-18: 100 mL via INTRAVENOUS

## 2016-05-18 MED ORDER — SODIUM CHLORIDE 0.9 % IJ SOLN
INTRAMUSCULAR | Status: DC
Start: 2016-05-18 — End: 2016-05-19
  Filled 2016-05-18: qty 50

## 2016-06-02 ENCOUNTER — Other Ambulatory Visit (HOSPITAL_COMMUNITY): Payer: Self-pay | Admitting: Gastroenterology

## 2016-06-02 DIAGNOSIS — K7460 Unspecified cirrhosis of liver: Secondary | ICD-10-CM

## 2016-06-02 DIAGNOSIS — R188 Other ascites: Principal | ICD-10-CM

## 2016-06-07 ENCOUNTER — Ambulatory Visit (HOSPITAL_COMMUNITY)
Admission: RE | Admit: 2016-06-07 | Discharge: 2016-06-07 | Disposition: A | Discharge status: Home / Self Care | Payer: 59 | Source: Ambulatory Visit | Attending: Gastroenterology | Admitting: Gastroenterology

## 2016-06-07 DIAGNOSIS — K7460 Unspecified cirrhosis of liver: Secondary | ICD-10-CM | POA: Insufficient documentation

## 2016-06-07 DIAGNOSIS — R531 Weakness: Secondary | ICD-10-CM | POA: Diagnosis not present

## 2016-06-07 DIAGNOSIS — K759 Inflammatory liver disease, unspecified: Secondary | ICD-10-CM | POA: Insufficient documentation

## 2016-06-07 DIAGNOSIS — A419 Sepsis, unspecified organism: Secondary | ICD-10-CM | POA: Diagnosis not present

## 2016-06-07 DIAGNOSIS — R188 Other ascites: Secondary | ICD-10-CM | POA: Insufficient documentation

## 2016-06-07 LAB — PROTEIN, PLEURAL OR PERITONEAL FLUID

## 2016-06-07 LAB — BODY FLUID CELL COUNT WITH DIFFERENTIAL
Eos, Fluid: 0 %
Lymphs, Fluid: 3 %
MONOCYTE-MACROPHAGE-SEROUS FLUID: 4 % — AB (ref 50–90)
Neutrophil Count, Fluid: 93 % — ABNORMAL HIGH (ref 0–25)
Total Nucleated Cell Count, Fluid: 4725 cu mm — ABNORMAL HIGH (ref 0–1000)

## 2016-06-07 LAB — ALBUMIN, PLEURAL OR PERITONEAL FLUID: ALBUMIN FL: 1 g/dL

## 2016-06-07 LAB — GRAM STAIN

## 2016-06-07 MED ORDER — ALBUMIN HUMAN 25 % IV SOLN
62.5000 g | Freq: Once | INTRAVENOUS | Status: AC
  Administered 2016-06-07: 62.5 g via INTRAVENOUS
  Filled 2016-06-07: qty 300

## 2016-06-07 MED ORDER — ALBUMIN HUMAN 25 % IV SOLN
INTRAVENOUS | Status: AC
  Filled 2016-06-07: qty 200

## 2016-06-07 MED ORDER — LIDOCAINE HCL 1 % IJ SOLN
INTRAMUSCULAR | Status: AC
  Filled 2016-06-07: qty 20

## 2016-06-07 MED ORDER — ALBUMIN HUMAN 25 % IV SOLN
INTRAVENOUS | Status: AC
  Filled 2016-06-07: qty 50

## 2016-06-07 NOTE — Procedures (Signed)
PROCEDURE SUMMARY:  Successful US guided paracentesis from right lateral abdomen.  Yielded 12 liters of clear dark gold fluid.  No immediate complications.  Pt tolerated well.   Specimen was sent for labs.  Corah Willeford S Yordy Matton PA-C 06/07/2016 11:30 AM

## 2016-06-08 ENCOUNTER — Ambulatory Visit: Payer: 59 | Admitting: Hematology & Oncology

## 2016-06-08 ENCOUNTER — Inpatient Hospital Stay (HOSPITAL_COMMUNITY)
Admission: EM | Admit: 2016-06-08 | Discharge: 2016-06-17 | DRG: 871 | Disposition: E | Payer: 59 | Attending: Internal Medicine | Admitting: Internal Medicine

## 2016-06-08 ENCOUNTER — Encounter (HOSPITAL_COMMUNITY): Payer: Self-pay | Admitting: Emergency Medicine

## 2016-06-08 ENCOUNTER — Other Ambulatory Visit: Payer: 59

## 2016-06-08 ENCOUNTER — Ambulatory Visit: Payer: 59

## 2016-06-08 ENCOUNTER — Encounter (HOSPITAL_COMMUNITY): Admission: EM | Disposition: E | Payer: Self-pay | Source: Home / Self Care | Attending: Internal Medicine

## 2016-06-08 ENCOUNTER — Emergency Department (HOSPITAL_COMMUNITY): Payer: 59

## 2016-06-08 DIAGNOSIS — R9431 Abnormal electrocardiogram [ECG] [EKG]: Secondary | ICD-10-CM

## 2016-06-08 DIAGNOSIS — Z7902 Long term (current) use of antithrombotics/antiplatelets: Secondary | ICD-10-CM

## 2016-06-08 DIAGNOSIS — A419 Sepsis, unspecified organism: Principal | ICD-10-CM | POA: Diagnosis present

## 2016-06-08 DIAGNOSIS — K652 Spontaneous bacterial peritonitis: Secondary | ICD-10-CM

## 2016-06-08 DIAGNOSIS — I2111 ST elevation (STEMI) myocardial infarction involving right coronary artery: Secondary | ICD-10-CM | POA: Diagnosis present

## 2016-06-08 DIAGNOSIS — I10 Essential (primary) hypertension: Secondary | ICD-10-CM | POA: Diagnosis present

## 2016-06-08 DIAGNOSIS — K7581 Nonalcoholic steatohepatitis (NASH): Secondary | ICD-10-CM | POA: Diagnosis present

## 2016-06-08 DIAGNOSIS — G9349 Other encephalopathy: Secondary | ICD-10-CM | POA: Diagnosis present

## 2016-06-08 DIAGNOSIS — R531 Weakness: Secondary | ICD-10-CM | POA: Diagnosis present

## 2016-06-08 DIAGNOSIS — C220 Liver cell carcinoma: Secondary | ICD-10-CM | POA: Diagnosis not present

## 2016-06-08 DIAGNOSIS — K766 Portal hypertension: Secondary | ICD-10-CM | POA: Diagnosis present

## 2016-06-08 DIAGNOSIS — C221 Intrahepatic bile duct carcinoma: Secondary | ICD-10-CM

## 2016-06-08 DIAGNOSIS — Z951 Presence of aortocoronary bypass graft: Secondary | ICD-10-CM | POA: Diagnosis not present

## 2016-06-08 DIAGNOSIS — N179 Acute kidney failure, unspecified: Secondary | ICD-10-CM | POA: Diagnosis present

## 2016-06-08 DIAGNOSIS — J9600 Acute respiratory failure, unspecified whether with hypoxia or hypercapnia: Secondary | ICD-10-CM | POA: Diagnosis not present

## 2016-06-08 DIAGNOSIS — I4891 Unspecified atrial fibrillation: Secondary | ICD-10-CM | POA: Diagnosis present

## 2016-06-08 DIAGNOSIS — C787 Secondary malignant neoplasm of liver and intrahepatic bile duct: Secondary | ICD-10-CM | POA: Diagnosis present

## 2016-06-08 DIAGNOSIS — R6521 Severe sepsis with septic shock: Secondary | ICD-10-CM | POA: Diagnosis present

## 2016-06-08 DIAGNOSIS — K7460 Unspecified cirrhosis of liver: Secondary | ICD-10-CM | POA: Diagnosis not present

## 2016-06-08 DIAGNOSIS — Z955 Presence of coronary angioplasty implant and graft: Secondary | ICD-10-CM

## 2016-06-08 DIAGNOSIS — Z820 Family history of epilepsy and other diseases of the nervous system: Secondary | ICD-10-CM

## 2016-06-08 DIAGNOSIS — I472 Ventricular tachycardia, unspecified: Secondary | ICD-10-CM

## 2016-06-08 DIAGNOSIS — I480 Paroxysmal atrial fibrillation: Secondary | ICD-10-CM | POA: Diagnosis not present

## 2016-06-08 DIAGNOSIS — E119 Type 2 diabetes mellitus without complications: Secondary | ICD-10-CM | POA: Diagnosis present

## 2016-06-08 DIAGNOSIS — I959 Hypotension, unspecified: Secondary | ICD-10-CM | POA: Diagnosis not present

## 2016-06-08 DIAGNOSIS — K7290 Hepatic failure, unspecified without coma: Secondary | ICD-10-CM | POA: Diagnosis present

## 2016-06-08 DIAGNOSIS — I2119 ST elevation (STEMI) myocardial infarction involving other coronary artery of inferior wall: Secondary | ICD-10-CM

## 2016-06-08 DIAGNOSIS — R64 Cachexia: Secondary | ICD-10-CM | POA: Diagnosis present

## 2016-06-08 DIAGNOSIS — K7031 Alcoholic cirrhosis of liver with ascites: Secondary | ICD-10-CM | POA: Diagnosis not present

## 2016-06-08 DIAGNOSIS — I2510 Atherosclerotic heart disease of native coronary artery without angina pectoris: Secondary | ICD-10-CM | POA: Diagnosis present

## 2016-06-08 DIAGNOSIS — Z6836 Body mass index (BMI) 36.0-36.9, adult: Secondary | ICD-10-CM

## 2016-06-08 DIAGNOSIS — I8500 Esophageal varices without bleeding: Secondary | ICD-10-CM | POA: Diagnosis present

## 2016-06-08 DIAGNOSIS — R161 Splenomegaly, not elsewhere classified: Secondary | ICD-10-CM

## 2016-06-08 DIAGNOSIS — Z66 Do not resuscitate: Secondary | ICD-10-CM | POA: Diagnosis not present

## 2016-06-08 DIAGNOSIS — R188 Other ascites: Secondary | ICD-10-CM | POA: Diagnosis present

## 2016-06-08 DIAGNOSIS — Z7982 Long term (current) use of aspirin: Secondary | ICD-10-CM

## 2016-06-08 DIAGNOSIS — I482 Chronic atrial fibrillation: Secondary | ICD-10-CM | POA: Diagnosis present

## 2016-06-08 DIAGNOSIS — E861 Hypovolemia: Secondary | ICD-10-CM | POA: Diagnosis present

## 2016-06-08 DIAGNOSIS — I8510 Secondary esophageal varices without bleeding: Secondary | ICD-10-CM

## 2016-06-08 DIAGNOSIS — E871 Hypo-osmolality and hyponatremia: Secondary | ICD-10-CM | POA: Diagnosis not present

## 2016-06-08 DIAGNOSIS — D689 Coagulation defect, unspecified: Secondary | ICD-10-CM | POA: Diagnosis present

## 2016-06-08 DIAGNOSIS — Z880 Allergy status to penicillin: Secondary | ICD-10-CM

## 2016-06-08 DIAGNOSIS — K767 Hepatorenal syndrome: Secondary | ICD-10-CM | POA: Diagnosis present

## 2016-06-08 DIAGNOSIS — Z7984 Long term (current) use of oral hypoglycemic drugs: Secondary | ICD-10-CM

## 2016-06-08 DIAGNOSIS — Z515 Encounter for palliative care: Secondary | ICD-10-CM | POA: Diagnosis not present

## 2016-06-08 DIAGNOSIS — Z8249 Family history of ischemic heart disease and other diseases of the circulatory system: Secondary | ICD-10-CM

## 2016-06-08 DIAGNOSIS — Z882 Allergy status to sulfonamides status: Secondary | ICD-10-CM

## 2016-06-08 DIAGNOSIS — K769 Liver disease, unspecified: Secondary | ICD-10-CM | POA: Diagnosis not present

## 2016-06-08 LAB — DIFFERENTIAL
Basophils Absolute: 0 10*3/uL (ref 0.0–0.1)
Basophils Relative: 0 %
EOS PCT: 0 %
Eosinophils Absolute: 0 10*3/uL (ref 0.0–0.7)
LYMPHS PCT: 9 %
Lymphs Abs: 0.9 10*3/uL (ref 0.7–4.0)
MONO ABS: 1.2 10*3/uL — AB (ref 0.1–1.0)
MONOS PCT: 12 %
NEUTROS ABS: 7.8 10*3/uL — AB (ref 1.7–7.7)
Neutrophils Relative %: 79 %

## 2016-06-08 LAB — TROPONIN I
TROPONIN I: 0.77 ng/mL — AB (ref <0.03)
TROPONIN I: 10.34 ng/mL — AB (ref <0.03)
Troponin I: 0.06 ng/mL (ref <0.03)

## 2016-06-08 LAB — COMPREHENSIVE METABOLIC PANEL
ALBUMIN: 2.5 g/dL — AB (ref 3.5–5.0)
ALK PHOS: 60 U/L (ref 38–126)
ALT: 29 U/L (ref 17–63)
AST: 97 U/L — AB (ref 15–41)
Anion gap: 16 — ABNORMAL HIGH (ref 5–15)
BILIRUBIN TOTAL: 4.5 mg/dL — AB (ref 0.3–1.2)
BUN: 62 mg/dL — AB (ref 6–20)
CALCIUM: 12 mg/dL — AB (ref 8.9–10.3)
CO2: 21 mmol/L — AB (ref 22–32)
CREATININE: 2.05 mg/dL — AB (ref 0.61–1.24)
Chloride: 93 mmol/L — ABNORMAL LOW (ref 101–111)
GFR calc Af Amer: 39 mL/min — ABNORMAL LOW (ref >60)
GFR calc non Af Amer: 34 mL/min — ABNORMAL LOW (ref >60)
GLUCOSE: 211 mg/dL — AB (ref 65–99)
Potassium: 4.7 mmol/L (ref 3.5–5.1)
Sodium: 130 mmol/L — ABNORMAL LOW (ref 135–145)
Total Protein: 4.7 g/dL — ABNORMAL LOW (ref 6.5–8.1)

## 2016-06-08 LAB — GLUCOSE, CAPILLARY
GLUCOSE-CAPILLARY: 184 mg/dL — AB (ref 65–99)
Glucose-Capillary: 180 mg/dL — ABNORMAL HIGH (ref 65–99)
Glucose-Capillary: 172 mg/dL — ABNORMAL HIGH (ref 65–99)

## 2016-06-08 LAB — AMMONIA: AMMONIA: 21 umol/L (ref 9–35)

## 2016-06-08 LAB — CBC
HEMATOCRIT: 49.7 % (ref 39.0–52.0)
Hemoglobin: 16.9 g/dL (ref 13.0–17.0)
MCH: 30.3 pg (ref 26.0–34.0)
MCHC: 34 g/dL (ref 30.0–36.0)
MCV: 89.1 fL (ref 78.0–100.0)
Platelets: 246 10*3/uL (ref 150–400)
RBC: 5.58 MIL/uL (ref 4.22–5.81)
RDW: 15.6 % — AB (ref 11.5–15.5)
WBC: 9.9 10*3/uL (ref 4.0–10.5)

## 2016-06-08 LAB — LACTIC ACID, PLASMA
LACTIC ACID, VENOUS: 4.9 mmol/L — AB (ref 0.5–1.9)
LACTIC ACID, VENOUS: 4.4 mmol/L — AB (ref 0.5–1.9)

## 2016-06-08 LAB — LIPID PANEL
Cholesterol: 55 mg/dL (ref 0–200)
HDL: 17 mg/dL — ABNORMAL LOW (ref >40)
LDL CALC: 24 mg/dL (ref 0–99)
TRIGLYCERIDES: 68 mg/dL (ref <150)
Total CHOL/HDL Ratio: 3.2 RATIO
VLDL: 14 mg/dL (ref 0–40)

## 2016-06-08 LAB — TYPE AND SCREEN
ABO/RH(D): O NEG
Antibody Screen: NEGATIVE

## 2016-06-08 LAB — HEPARIN LEVEL (UNFRACTIONATED)

## 2016-06-08 LAB — APTT: aPTT: 35 seconds (ref 24–36)

## 2016-06-08 LAB — PROTIME-INR
INR: 1.91
Prothrombin Time: 22.1 seconds — ABNORMAL HIGH (ref 11.4–15.2)

## 2016-06-08 LAB — PROCALCITONIN: Procalcitonin: 16.09 ng/mL

## 2016-06-08 LAB — ABO/RH: ABO/RH(D): O NEG

## 2016-06-08 LAB — MRSA PCR SCREENING: MRSA BY PCR: NEGATIVE

## 2016-06-08 LAB — CORTISOL: Cortisol, Plasma: >100 ug/dL

## 2016-06-08 LAB — PSA: PSA: 2.61 ng/mL (ref 0.00–4.00)

## 2016-06-08 SURGERY — LEFT HEART CATH AND CORONARY ANGIOGRAPHY

## 2016-06-08 MED ORDER — SODIUM CHLORIDE 0.9 % IV SOLN
0.0000 ug/min | INTRAVENOUS | Status: DC
  Administered 2016-06-08: 130 ug/min via INTRAVENOUS
  Administered 2016-06-08: 135 ug/min via INTRAVENOUS
  Administered 2016-06-08: 140 ug/min via INTRAVENOUS
  Administered 2016-06-09: 100 ug/min via INTRAVENOUS
  Administered 2016-06-09: 135 ug/min via INTRAVENOUS
  Administered 2016-06-09: 100 ug/min via INTRAVENOUS
  Administered 2016-06-10: 150 ug/min via INTRAVENOUS
  Filled 2016-06-08 (×10): qty 4

## 2016-06-08 MED ORDER — SODIUM CHLORIDE 0.9 % IV BOLUS (SEPSIS)
1000.0000 mL | Freq: Once | INTRAVENOUS | Status: AC
  Administered 2016-06-08: 1000 mL via INTRAVENOUS

## 2016-06-08 MED ORDER — PANTOPRAZOLE SODIUM 40 MG IV SOLR
40.0000 mg | Freq: Every day | INTRAVENOUS | Status: DC
  Administered 2016-06-08: 40 mg via INTRAVENOUS
  Filled 2016-06-08: qty 40

## 2016-06-08 MED ORDER — HEPARIN SODIUM (PORCINE) 5000 UNIT/ML IJ SOLN: 4000.0000 [IU] | Freq: Once | INTRAMUSCULAR | Status: DC

## 2016-06-08 MED ORDER — VANCOMYCIN HCL 10 G IV SOLR
1750.0000 mg | INTRAVENOUS | Status: DC
  Filled 2016-06-08: qty 1750

## 2016-06-08 MED ORDER — METRONIDAZOLE 500 MG PO TABS
500.0000 mg | ORAL_TABLET | Freq: Once | ORAL | Status: AC
  Administered 2016-06-08: 500 mg via ORAL
  Filled 2016-06-08: qty 1

## 2016-06-08 MED ORDER — ORAL CARE MOUTH RINSE
15.0000 mL | Freq: Two times a day (BID) | OROMUCOSAL | Status: DC
  Administered 2016-06-09: 15 mL via OROMUCOSAL

## 2016-06-08 MED ORDER — SODIUM CHLORIDE 0.9 % IV BOLUS (SEPSIS)
2000.0000 mL | Freq: Once | INTRAVENOUS | Status: AC
  Administered 2016-06-08: 2000 mL via INTRAVENOUS

## 2016-06-08 MED ORDER — VANCOMYCIN HCL 10 G IV SOLR
2500.0000 mg | Freq: Once | INTRAVENOUS | Status: AC
  Administered 2016-06-08: 2500 mg via INTRAVENOUS
  Filled 2016-06-08: qty 2500

## 2016-06-08 MED ORDER — HEPARIN (PORCINE) IN NACL 100-0.45 UNIT/ML-% IJ SOLN
2600.0000 [IU]/h | INTRAMUSCULAR | Status: DC
  Administered 2016-06-08: 1700 [IU]/h via INTRAVENOUS
  Administered 2016-06-09 – 2016-06-10 (×2): 2350 [IU]/h via INTRAVENOUS
  Filled 2016-06-08 (×3): qty 250

## 2016-06-08 MED ORDER — LACTULOSE 10 GM/15ML PO SOLN
20.0000 g | Freq: Two times a day (BID) | ORAL | Status: DC
  Administered 2016-06-08 – 2016-06-09 (×3): 20 g via ORAL
  Filled 2016-06-08 (×3): qty 30

## 2016-06-08 MED ORDER — ALBUMIN HUMAN 25 % IV SOLN
100.0000 g | Freq: Every day | INTRAVENOUS | Status: DC
  Administered 2016-06-08 – 2016-06-09 (×2): 100 g via INTRAVENOUS
  Filled 2016-06-08 (×4): qty 400
  Filled 2016-06-08: qty 50
  Filled 2016-06-08: qty 300
  Filled 2016-06-08: qty 50
  Filled 2016-06-08: qty 400

## 2016-06-08 MED ORDER — CHLORHEXIDINE GLUCONATE CLOTH 2 % EX PADS
6.0000 | MEDICATED_PAD | Freq: Every day | CUTANEOUS | Status: DC
  Administered 2016-06-09: 6 via TOPICAL

## 2016-06-08 MED ORDER — ALBUMIN NICU 25% IV SOLUTION: 1.0000 mL/kg | Freq: Once | INTRAVENOUS | Status: DC

## 2016-06-08 MED ORDER — NOREPINEPHRINE BITARTRATE 1 MG/ML IV SOLN
0.0000 ug/min | INTRAVENOUS | Status: AC
  Administered 2016-06-08: 2 ug/min via INTRAVENOUS
  Filled 2016-06-08: qty 4

## 2016-06-08 MED ORDER — SODIUM CHLORIDE 0.9 % IV SOLN: 250.0000 mL | INTRAVENOUS | Status: DC | PRN

## 2016-06-08 MED ORDER — AMIODARONE HCL IN DEXTROSE 360-4.14 MG/200ML-% IV SOLN
60.0000 mg/h | INTRAVENOUS | Status: AC
Start: 2016-06-08 — End: 2016-06-08
  Administered 2016-06-08 (×3): 60 mg/h via INTRAVENOUS
  Filled 2016-06-08: qty 200

## 2016-06-08 MED ORDER — FENTANYL CITRATE (PF) 100 MCG/2ML IJ SOLN
INTRAMUSCULAR | Status: AC
  Filled 2016-06-08: qty 2

## 2016-06-08 MED ORDER — SODIUM CHLORIDE 0.9 % IV SOLN
0.0000 ug/min | INTRAVENOUS | Status: DC
  Administered 2016-06-08: 20 ug/min via INTRAVENOUS
  Administered 2016-06-08: 130 ug/min via INTRAVENOUS
  Filled 2016-06-08 (×2): qty 1

## 2016-06-08 MED ORDER — SODIUM CHLORIDE 0.9 % IV BOLUS (SEPSIS)
750.0000 mL | Freq: Once | INTRAVENOUS | Status: AC
  Administered 2016-06-08: 750 mL via INTRAVENOUS

## 2016-06-08 MED ORDER — ASPIRIN 81 MG PO CHEW: 324.0000 mg | CHEWABLE_TABLET | Freq: Once | ORAL | Status: DC

## 2016-06-08 MED ORDER — SODIUM CHLORIDE 0.9% FLUSH
10.0000 mL | Freq: Two times a day (BID) | INTRAVENOUS | Status: DC
  Administered 2016-06-09 (×2): 10 mL

## 2016-06-08 MED ORDER — AMIODARONE HCL IN DEXTROSE 360-4.14 MG/200ML-% IV SOLN
30.0000 mg/h | INTRAVENOUS | Status: DC
  Administered 2016-06-08 – 2016-06-10 (×4): 30 mg/h via INTRAVENOUS
  Filled 2016-06-08 (×5): qty 200

## 2016-06-08 MED ORDER — SODIUM CHLORIDE 0.9% FLUSH: 10.0000 mL | INTRAVENOUS | Status: DC | PRN

## 2016-06-08 MED ORDER — FENTANYL CITRATE (PF) 100 MCG/2ML IJ SOLN
50.0000 ug | Freq: Once | INTRAMUSCULAR | Status: AC
  Administered 2016-06-08: 50 ug via INTRAVENOUS

## 2016-06-08 MED ORDER — DEXTROSE 5 % IV SOLN
1.0000 g | Freq: Three times a day (TID) | INTRAVENOUS | Status: DC
  Administered 2016-06-08 – 2016-06-10 (×6): 1 g via INTRAVENOUS
  Filled 2016-06-08 (×7): qty 1

## 2016-06-08 MED ORDER — FENTANYL CITRATE (PF) 100 MCG/2ML IJ SOLN
25.0000 ug | Freq: Once | INTRAMUSCULAR | Status: AC
Start: 2016-06-08 — End: 2016-06-08
  Administered 2016-06-08: 25 ug via INTRAVENOUS

## 2016-06-08 MED ORDER — ALBUMIN HUMAN 5 % IV SOLN
25.0000 g | Freq: Once | INTRAVENOUS | Status: AC
  Administered 2016-06-08: 25 g via INTRAVENOUS
  Filled 2016-06-08: qty 500

## 2016-06-08 MED ORDER — CIPROFLOXACIN IN D5W 400 MG/200ML IV SOLN
400.0000 mg | Freq: Once | INTRAVENOUS | Status: AC
  Administered 2016-06-08: 400 mg via INTRAVENOUS
  Filled 2016-06-08: qty 200

## 2016-06-08 MED ORDER — AMIODARONE LOAD VIA INFUSION
150.0000 mg | Freq: Once | INTRAVENOUS | Status: AC
  Administered 2016-06-08: 150 mg via INTRAVENOUS
  Filled 2016-06-08: qty 83.34

## 2016-06-08 MED ORDER — HEPARIN (PORCINE) IN NACL 100-0.45 UNIT/ML-% IJ SOLN
1400.0000 [IU]/h | INTRAMUSCULAR | Status: DC
  Administered 2016-06-08: 1400 [IU]/h via INTRAVENOUS
  Filled 2016-06-08: qty 250

## 2016-06-08 NOTE — ED Notes (Signed)
Pt given 324 asa and 500cc fluids by ems

## 2016-06-08 NOTE — Progress Notes (Signed)
ANTICOAGULATION CONSULT NOTE - FOLLOW UP    HL = < 0.1 (goal 0.3 - 0.7 units/mL) Heparin dosing weight = 113 kg   Assessment: [name] continues on IV heparin for STEMI.  Heparin level is undetectable.  No bleeding nor issue with heparin infusion per RN.  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   Plan: - Increase heparin gtt to 1700 units/hr, no bolus d/t elevated INR/liver complications - Check 6 hr heparin level    Stephen Marquez, PharmD, BCPS 05/27/2016, 7:21 PM

## 2016-06-08 NOTE — ED Notes (Signed)
Dr. Dayna Barker aware of pt BP, ordered 2 L bolus

## 2016-06-08 NOTE — ED Notes (Signed)
Per DR. Hoffman, to titrate down levo and titrate up neo-synephrine.

## 2016-06-08 NOTE — ED Provider Notes (Signed)
[facility] DEPT Provider Note   CSN: [id number] Arrival date & time: 06/15/2016  [id number]     History   Chief Complaint Chief Complaint  Patient presents with  . Weakness  . Code STEMI    HPI Stephen Marquez is a 61 y.o. male.  HPI   61 year old male with a past medical history of cirrhosis, diabetes, age of fibrillation, hypertension and coronary artery disease status post CABG in the 90s and multiple stents since that time presents to the emergency department secondary to fall. Patient's been weak and decreased appetite and decreased fluid intake for the last couple days.Yesterday had a paracentesis (12L) done which showed gram-positive cocci and was started on ciprofloxacin. Today he was feeling weak and he fell so EMS was called. On EMSs arrival patient had a low blood pressure and appeared ill. EKG was done which showed inferior ST elevation. A code STEMI was called however the cath lab was not really the patient got here. On my examination patient does not appear well and complains of no chest pain, shortness of breath, dizziness, diaphoresis. He does endorse nausea vomiting and lightheadedness.  Past Medical History:  Diagnosis Date  . A-fib [facility]) '  . CAD (coronary artery disease)   . Diabetes ([place])   . HTN (hypertension)     Patient Active Problem List   Diagnosis Date Noted  . Septic shock ([place]) 06/01/2016  . Hypotension 05/20/2016  . Obesity 03/30/2013  . Mixed hyperlipidemia 03/30/2013  . Atrial flutter ([place]) 03/30/2013  . CAD (coronary artery disease)   . HTN (hypertension)   . Diabetes ([place])   . A-fib [facility])     Past Surgical History:  Procedure Laterality Date  . heart ablation    . heart stent    . quadruple bypass         Home Medications    Prior to Admission medications   Medication Sig Start Date End Date Taking? Authorizing Provider  acetaminophen (TYLENOL) 650 MG CR tablet Take 1,300 mg by mouth every 8 (eight) hours as needed for pain.    Yes Historical Provider, MD  aspirin 81 MG tablet Take 81 mg by mouth daily.   Yes Historical Provider, MD  atorvastatin (LIPITOR) 40 MG tablet Take 1 tablet (40 mg total) by mouth daily. 01/02/16  Yes Jettie Booze, MD  clopidogrel (PLAVIX) 75 MG tablet Take 75 mg by mouth daily.  01/25/13  Yes Historical Provider, MD  empagliflozin (JARDIANCE) 25 MG TABS tablet Take 25 mg by mouth daily.    Yes Historical Provider, MD  Famotidine 20 MG CHEW Chew 20 mg by mouth 2 (two) times daily.    Yes Historical Provider, MD  fenofibrate 160 MG tablet Take 160 mg by mouth daily.  01/25/13  Yes Historical Provider, MD  folic acid (FOLVITE) [id number] MCG tablet Take 800 mcg by mouth daily.   Yes Historical Provider, MD  furosemide (LASIX) 40 MG tablet Take 40 mg by mouth daily.   Yes Historical Provider, MD  Icosapent Ethyl (VASCEPA) 1 G CAPS 2 capsules twice a day 03/18/14  Yes Jettie Booze, MD  isosorbide mononitrate (IMDUR) 60 MG 24 hr tablet Take 60 mg by mouth daily.  01/25/13  Yes Historical Provider, MD  losartan (COZAAR) 25 MG tablet Take 25 mg by mouth daily.  02/27/13  Yes Historical Provider, MD  metoprolol (LOPRESSOR) 100 MG tablet Take 100 mg by mouth 2 (two) times daily.  01/25/13  Yes Historical Provider, MD  Multiple Vitamins-Minerals (MULTIVITAMIN PO) Take 1 tablet by mouth daily.    Yes Historical Provider, MD  niacin 500 MG tablet Take 1,500 mg by mouth 2 (two) times daily with a meal.    Yes Historical Provider, MD  ondansetron (ZOFRAN) 4 MG tablet Take 4 mg by mouth 3 (three) times daily as needed for nausea or vomiting.  06/06/16  Yes Historical Provider, MD  pioglitazone (ACTOS) 45 MG tablet Take 45 mg by mouth daily.  01/25/13  Yes Historical Provider, MD  spironolactone (ALDACTONE) 25 MG tablet Take 100 mg by mouth 2 (two) times daily.    Yes Historical Provider, MD  trolamine salicylate (ASPERCREME) 10 % cream Apply 1 application topically as needed for muscle pain.   Yes Historical  Provider, MD  nitroGLYCERIN (NITROSTAT) 0.4 MG SL tablet Place 1 tablet (0.4 mg total) under the tongue every 5 (five) minutes as needed for chest pain (3 DOSES MAX). 11/20/15   Jettie Booze, MD    Family History Family History  Problem Relation Age of Onset  . Heart disease Father   . Dementia Mother   . Hypertension Mother     Social History Social History  Substance Use Topics  . Smoking status: Never Smoker  . Smokeless tobacco: Not on file  . Alcohol use Not on file     Allergies   Penicillins and Sulfur   Review of Systems Review of Systems  All other systems reviewed and are negative.    Physical Exam Updated Vital Signs BP (!) 93/45   Pulse 68   Temp 97.7 F (36.5 C) (Axillary)   Resp (!) 22   SpO2 99%   Physical Exam  Constitutional: He appears cachectic. He appears distressed.  Eyes: Conjunctivae and EOM are normal.  Cardiovascular:  hypotensive  Pulmonary/Chest: Tachypnea noted. He has decreased breath sounds.  Abdominal: He exhibits distension and ascites. There is generalized tenderness. There is guarding. There is no rigidity.  Musculoskeletal: He exhibits no edema or deformity.  Neurological: He is alert.  Skin: Skin is dry. Capillary refill takes more than 3 seconds.  cool  Psychiatric: He has a normal mood and affect.  Nursing note and vitals reviewed.    ED Treatments / Results  Labs (all labs ordered are listed, but only abnormal results are displayed) Labs Reviewed  CBC - Abnormal; Notable for the following:       Result Value   RDW 15.6 (*)    All other components within normal limits  DIFFERENTIAL - Abnormal; Notable for the following:    Neutro Abs 7.8 (*)    Monocytes Absolute 1.2 (*)    All other components within normal limits  PROTIME-INR - Abnormal; Notable for the following:    Prothrombin Time 22.1 (*)    All other components within normal limits  COMPREHENSIVE METABOLIC PANEL - Abnormal; Notable for the  following:    Sodium 130 (*)    Chloride 93 (*)    CO2 21 (*)    Glucose, Bld 211 (*)    BUN 62 (*)    Creatinine, Ser 2.05 (*)    Calcium 12.0 (*)    Total Protein 4.7 (*)    Albumin 2.5 (*)    AST 97 (*)    Total Bilirubin 4.5 (*)    GFR calc non Af Amer 34 (*)    GFR calc Af Amer 39 (*)    Anion gap 16 (*)    All other components within normal  limits  TROPONIN I - Abnormal; Notable for the following:    Troponin I 0.06 (*)    All other components within normal limits  LIPID PANEL - Abnormal; Notable for the following:    HDL 17 (*)    All other components within normal limits  LACTIC ACID, PLASMA - Abnormal; Notable for the following:    Lactic Acid, Venous 4.9 (*)    All other components within normal limits  LACTIC ACID, PLASMA - Abnormal; Notable for the following:    Lactic Acid, Venous 4.4 (*)    All other components within normal limits  CULTURE, BLOOD (ROUTINE X 2)  CULTURE, BLOOD (ROUTINE X 2)  MRSA PCR SCREENING  APTT  AMMONIA  PROCALCITONIN  CORTISOL  HEPARIN LEVEL (UNFRACTIONATED)  HIV ANTIBODY (ROUTINE TESTING)  TROPONIN I  TROPONIN I  TROPONIN I  TYPE AND SCREEN  ABO/RH    EKG  EKG Interpretation  Date/Time:  Tuesday June 08 2016 07:29:01 EST Ventricular Rate:  91 PR Interval:    QRS Duration: 106 QT Interval:  336 QTC Calculation: 414 R Axis:   40 Text Interpretation:  Sinus rhythm Inferior infarct, acute (RCA) Anteroseptal infarct, age indeterminate Probable RV involvement, suggest recording right precordial leads Baseline wander in lead(s) V3 Confirmed by [facility] MD, Corene Cornea [phone]) on 05/30/2016 7:50:13 AM       Radiology Ct Head Wo Contrast  Result Date: 06/05/2016 CLINICAL DATA:  Recent fall, patient is on a blood thinner EXAM: CT HEAD WITHOUT CONTRAST TECHNIQUE: Contiguous axial images were obtained from the base of the skull through the vertex without intravenous contrast. COMPARISON:  None. FINDINGS: Brain: The ventricular system  is normal in size and configuration for age, and the septum is in a normal midline position. The fourth ventricle and basilar cisterns are unremarkable. No hemorrhage, mass lesion, or acute infarction is seen. Scattered dural calcifications are present. Vascular: No vascular abnormality seen on this unenhanced study. Skull: No calvarial fracture is seen. Sinuses/Orbits: The paranasal sinuses are well pneumatized. Other: None. IMPRESSION: Negative unenhanced CT of the brain. Electronically Signed   By: Ivar Drape M.D.   On: 05/31/2016 08:19   US Paracentesis  Result Date: 06/07/2016 INDICATION: Recurrent abdominal ascites secondary to hepatic cirrhosis. Request for diagnostic and therapeutic paracentesis up to 12 liters. EXAM: ULTRASOUND GUIDED RIGHT LATERAL ABDOMEN PARACENTESIS MEDICATIONS: 1% Lidocaine = 10 mL. COMPLICATIONS: None immediate. PROCEDURE: Informed written consent was obtained from the patient after a discussion of the risks, benefits and alternatives to treatment. A timeout was performed prior to the initiation of the procedure. Initial ultrasound scanning demonstrates a large amount of ascites within the right lateral abdomen. The right lateral abdomen was prepped and draped in the usual sterile fashion. 1% lidocaine with epinephrine was used for local anesthesia. Following this, a 19 gauge, 7-cm, Yueh catheter was introduced. An ultrasound image was saved for documentation purposes. The paracentesis was performed. The catheter was removed and a dressing was applied. The patient tolerated the procedure well without immediate post procedural complication. FINDINGS: A total of approximately 12 liters of clear dark gold fluid was removed. Samples were sent to the laboratory as requested by the clinical team. IMPRESSION: Successful ultrasound-guided paracentesis yielding 12 liters of peritoneal fluid. Read by:  Gareth Eagle, PA-C Electronically Signed   By: Markus Daft M.D.   On: 06/07/2016 11:24     Procedures .Central Line Date/Time: 05/20/2016 1:06 PM Performed by: Merrily Pew Authorized by: Merrily Pew   Consent:  Consent obtained:  Verbal   Consent given by:  Patient and spouse   Risks discussed:  Arterial puncture, incorrect placement, bleeding and infection   Alternatives discussed:  Alternative treatment Pre-procedure details:    Hand hygiene: Hand hygiene performed prior to insertion     Sterile barrier technique: All elements of maximal sterile technique followed     Skin preparation:  2% chlorhexidine   Skin preparation agent: Skin preparation agent completely dried prior to procedure   Anesthesia (see MAR for exact dosages):    Anesthesia method:  Local infiltration   Local anesthetic:  Lidocaine 1% WITH epi Procedure details:    Location:  R femoral   Site selection rationale:  Couldn't lay flat for subclavicular or IJ   Procedural supplies:  Triple lumen   Catheter size:  7.5 Fr   Ultrasound guidance: yes     Sterile ultrasound techniques: Sterile gel and sterile probe covers were used     Number of attempts:  1   Successful placement: yes   Post-procedure details:    Post-procedure:  Dressing applied and line sutured   Assessment:  Blood return through all ports and free fluid flow   Patient tolerance of procedure:  Tolerated well, no immediate complications   (including critical care time)  CRITICAL CARE Performed by: Merrily Pew Total critical care time: 65 minutes Critical care time was exclusive of separately billable procedures and treating other patients. Critical care was necessary to treat or prevent imminent or life-threatening deterioration. Critical care was time spent personally by me on the following activities: development of treatment plan with patient and/or surrogate as well as nursing, discussions with consultants, evaluation of patient's response to treatment, examination of patient, obtaining history from patient or  surrogate, ordering and performing treatments and interventions, ordering and review of laboratory studies, ordering and review of radiographic studies, pulse oximetry and re-evaluation of patient's condition.   Medications Ordered in ED Medications  aspirin chewable tablet 324 mg (324 mg Oral Not Given 05/22/2016 0736)  fentaNYL (SUBLIMAZE) 100 MCG/2ML injection (not administered)  heparin ADULT infusion 100 units/mL (25000 units/25mL sodium chloride 0.45%) (1,400 Units/hr Intravenous New Bag/Given 06/12/2016 0908)  fentaNYL (SUBLIMAZE) 100 MCG/2ML injection (not administered)  amiodarone (NEXTERONE) 1.8 mg/mL load via infusion 150 mg (150 mg Intravenous Bolus from Bag 06/15/2016 0955)    Followed by  amiodarone (NEXTERONE PREMIX) 360-4.14 MG/200ML-% (1.8 mg/mL) IV infusion (60 mg/hr Intravenous New Bag/Given 06/13/2016 1225)    Followed by  amiodarone (NEXTERONE PREMIX) 360-4.14 MG/200ML-% (1.8 mg/mL) IV infusion (not administered)  0.9 %  sodium chloride infusion (not administered)  pantoprazole (PROTONIX) injection 40 mg (not administered)  vancomycin (VANCOCIN) 2,500 mg in sodium chloride 0.9 % 500 mL IVPB (2,500 mg Intravenous Given 06/05/2016 1225)  vancomycin (VANCOCIN) 1,750 mg in sodium chloride 0.9 % 500 mL IVPB (not administered)  aztreonam (AZACTAM) 1 g in dextrose 5 % 50 mL IVPB (1 g Intravenous Given 06/15/2016 1224)  phenylephrine (NEO-SYNEPHRINE) 40 mg in sodium chloride 0.9 % 250 mL (0.16 mg/mL) infusion (not administered)  lactulose (CHRONULAC) 10 GM/15ML solution 20 g (not administered)  sodium chloride 0.9 % bolus 2,000 mL (0 mLs Intravenous Stopped 06/05/2016 0900)  albumin human 5 % solution 25 g (25 g Intravenous Rate/Dose Change 05/27/2016 0846)  ciprofloxacin (CIPRO) IVPB 400 mg (0 mg Intravenous Stopped 06/09/2016 1030)  metroNIDAZOLE (FLAGYL) tablet 500 mg (500 mg Oral Given 05/29/2016 0911)  fentaNYL (SUBLIMAZE) injection 25 mcg (25 mcg Intravenous Given 05/28/2016 0837)  norepinephrine  (LEVOPHED) 4 mg in dextrose 5 % 250 mL (0.016 mg/mL) infusion (0 mcg/min Intravenous Stopped 05/20/2016 1105)  fentaNYL (SUBLIMAZE) injection 50 mcg (50 mcg Intravenous Given 05/30/2016 0903)  sodium chloride 0.9 % bolus 1,000 mL (0 mLs Intravenous Stopped 06/09/2016 0958)     Initial Impression / Assessment and Plan / ED Course  I have reviewed the triage vital signs and the nursing notes.  Pertinent labs & imaging results that were available during my care of the patient were reviewed by me and considered in my medical decision making (see chart for details).  STEMI - Patient with obvious ST elevation in the inferior leads with reciprocal depressions. Patient evaluated immediately on arrival by cardiology and repeat EKG done that demonstrated the same findings. Secondary to multiple other abnormalities (as below) patient was not felt to be a candidate for catheterization at this time. We'll start on heparin. Cardiology will follow.  Ventricular Tachycardia - this started after being started on levophed. Patient was switched to Neo-Synephrine and started on amiodarone drip with resolution of it. I think it is just is likely related to the proarrhythmic effects of levophed as he could be related to his ongoing myocardial infarction.  SBP - patient gram-positive cocci pending cultures from a paracentesis yesterday. He had a dose of Cipro has allergy to penicillins, so will continue Cipro and Flagyl as well. Suspect this is also was causing his shock.  Shock - likely secondary to SBP. Did not improve with multiple liters of fluid along with albumin. Started on levophed as well.   Final Clinical Impressions(s) / ED Diagnoses   Final diagnoses:  Acute ST elevation myocardial infarction (STEMI) involving right coronary artery (HCC)  Septic shock (HCC)  SBP (spontaneous bacterial peritonitis) ([place])  V-tach (HCC)      Merrily Pew, MD 05/20/2016 1420

## 2016-06-08 NOTE — Progress Notes (Signed)
ANTICOAGULATION CONSULT NOTE - Initial Consult  Pharmacy Consult for heparin Indication: chest pain/ACS  Allergies  Allergen Reactions  . Penicillins Rash  . Sulfur Swelling    Patient Measurements:   Heparin Dosing Weight: 113kg  Vital Signs: Temp: 99.5 F (37.5 C) (02/20 0735) Temp Source: Rectal (02/20 0735) BP: 85/58 (02/20 0846) Pulse Rate: 84 (02/20 0846)  Labs:  Recent Labs  06/09/2016 0729  HGB 16.9  HCT 49.7  PLT 246  APTT 35  LABPROT 22.1*  INR 1.91  CREATININE 2.05*  TROPONINI 0.06*    Estimated Creatinine Clearance: 56.7 mL/min (by C-G formula based on SCr of 2.05 mg/dL (H)).   Medical History: Past Medical History:  Diagnosis Date  . A-fib [facility]) '  . CAD (coronary artery disease)   . Diabetes ([place])   . HTN (hypertension)     Medications:  Infusions:  . ciprofloxacin    . heparin 1,400 Units/hr (05/28/2016 0908)    Assessment: 72 yom presented to the ED as a Code STEMI. Troponin mildly elevated. To start IV heparin. Of note, INR is elevated d/t liver dysfunction. Baseline H/H + platelets are WNL. No blood thinners PTA.   Goal of Therapy:  Heparin level 0.3-0.7 units/ml Monitor platelets by anticoagulation protocol: Yes   Plan:  Heparin gtt 1400 units/hr - no bolus at this time due elevated INR Check an 8 hr heparin level Daily heparin level and CBC  Stephen Marquez, Rande Lawman 06/05/2016,9:07 AM

## 2016-06-08 NOTE — ED Triage Notes (Signed)
Per [facility], pt c/o weakness and no appetite for a few days, pt BP per ems was 80/56, STEMI on monitor. HR 94, sats in 90s. Denies CP. Pt has IO in L leg and 22 in L hand. Hx of liver cancer, pt had paracentesis yesterday.Pt in NAD

## 2016-06-08 NOTE — Consult Note (Signed)
CARDIOLOGY CONSULT NOTE      Patient ID: Stephen Marquez MRN: [id number] DOB/AGE: 60-Oct-1957 60 y.o.  Admit date: 06/15/2016 Referring PhysicianJason Mesner, MD Primary Nonah Mattes, MD Primary Cardiologist [facility] Reason for Consultation ECG changes  HPI: 61 year old man with known coronary artery disease and prior atrial fibrillation. He has recently been diagnosed with hepatocellular carcinoma. He has had ascites in fact yesterday, had a large-volume paracentesis. He is under consideration for liver transplant but not on the list.  According the wife, over the past 3 days, he has been feeling nauseated and no appetite. He started having abdominal pain yesterday. He was diagnosed with SBP after his paracentesis and was started on antibiotics.  This morning, the wife was helping him to the bathroom. He got weak and fell back and hit his head. EMS was called after this. ECG revealed inferior ST elevation which is new.  Currently, patient denies any chest discomfort. He has the stomach pain that he has had over the past several days. Repeat ECG showed a decrease in his ST elevation. He is being sent for head CT due to his potential coagulopathy and head trauma. Coagulation labs are not available at this time.   Review of systems complete and found to be negative unless listed above   Past Medical History:  Diagnosis Date  . A-fib [facility]) '  . CAD (coronary artery disease)   . Diabetes ([place])   . HTN (hypertension)     Family History  Problem Relation Age of Onset  . Heart disease Father   . Dementia Mother   . Hypertension Mother     Social History   Social History  . Marital status: Married    Spouse name: N/A  . Number of children: N/A  . Years of education: N/A   Occupational History  . Not on file.   Social History Main Topics  . Smoking status: Never Smoker  . Smokeless tobacco: Not on file  . Alcohol use Not on file  . Drug use: Unknown  . Sexual  activity: Not on file   Other Topics Concern  . Not on file   Social History Narrative  . No narrative on file    Past Surgical History:  Procedure Laterality Date  . heart ablation    . heart stent    . quadruple bypass        (Not in a hospital admission)  Physical Exam: Vitals:   Vitals:   06/16/2016 0724 06/03/2016 0731 06/07/2016 0735 05/26/2016 0752  BP:  97/57  (!) 58/47  Pulse:  92  92  Resp:  15  (!) 28  Temp:   99.5 F (37.5 C)   TempSrc:   Rectal   SpO2: 93% 90%  100%   I&O's:  No intake or output data in the 24 hours ending 05/28/2016 0754 Physical exam: frail  [place]/AT EOMI No JVD, No carotid bruit RRR S1S2  No wheezing Soft. NT, ; distended; + fluid wave No edema.  No focal motor or sensory deficits Flat affect Scars on legs  Labs:   Lab Results  Component Value Date   WBC 9.9 06/04/2016   HGB 16.9 05/26/2016   HCT 49.7 05/31/2016   MCV 89.1 05/26/2016   PLT 246 06/09/2016   No results for input(s): NA, K, CL, CO2, BUN, CREATININE, CALCIUM, PROT, BILITOT, ALKPHOS, ALT, AST, GLUCOSE in the last 168 hours.  Invalid input(s): LABALBU No results found for: CKTOTAL, CKMB, CKMBINDEX, TROPONINI Lab Results  Component Value Date   CHOL 111 (L) 10/20/2015   CHOL 143 11/11/2014   CHOL 138 07/09/2013   Lab Results  Component Value Date   HDL 28 (L) 10/20/2015   HDL 32.30 (L) 11/11/2014   HDL 27 (L) 07/09/2013   Lab Results  Component Value Date   LDLCALC 43 10/20/2015   LDLCALC NOT CALC 07/09/2013   Lab Results  Component Value Date   TRIG 201 (H) 10/20/2015   TRIG 214.0 (H) 11/11/2014   TRIG 417 (H) 07/09/2013   Lab Results  Component Value Date   CHOLHDL 4.0 10/20/2015   CHOLHDL 4 11/11/2014   CHOLHDL 5 04/06/2013   Lab Results  Component Value Date   LDLDIRECT 70.0 11/11/2014   LDLDIRECT 77.0 04/06/2013      Radiology: pending EKG:NSR, inferior ST elevation with reciprocal changes  ASSESSMENT AND PLAN:  Active Problems:   * No  active hospital problems. *  Acute inferior MI in the setting of ascites, cirrhosis and hepatocellular carcinoma And spontaneous bacterial peritonitis.  Known CAD  I rechecked the patient about an hour after initially seeing him in the emergency room.  Head CT was negative.  He remains chest pain-free. He does have significant abdominal pain, particularly in the lower portion, worse with palpation. This does not feel like his prior heart pain. He has had some hypotension. Albumin has been started. He is on norepinephrine. Given that his INR is 1.9 and creatinine is 2.0, at this point, I think the risks of cardiac cath outweigh the benefits. I discussed this with the patient and his wife. They're in agreement. If there is a change in his symptoms, we could change our course. I discussed this with Dr. Dayna Barker.  Continue aggressive secondary prevention. Agree with IV heparin for now.  Will follow.  AFib:  Currently NSR.  Critical care time 50 minutes    Signed:   Mina Marble, MD, [facility] 05/28/2016, 7:54 AM

## 2016-06-08 NOTE — Consult Note (Signed)
Marland Kitchen    HEMATOLOGY/ONCOLOGY CONSULTATION NOTE  Date of Service: 05/26/2016  Patient Care Team: Lawerance Cruel, MD as PCP - General (Family Medicine)  CHIEF COMPLAINTS/PURPOSE OF CONSULTATION:  Liver lesions concerning for malignancy  HISTORY OF PRESENTING ILLNESS:   Stephen Marquez is a wonderful 61 y.o. male who has been referred to [place] by Dr Brand Males, MD  for evaluation and management of liver lesions concerning for primary or metastatic malignancy.  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 He has esophageal varices, portal htn, ascites, splenomegaly, coagulopathy, thrombocytopenia related to his liver cirrhosis. He had an [place] abd on 04/08/2016 which showed liver cirrhotic changes within the liver and no suspicious hepatic masses. He had rapidly increasing ascites and had US guided paracentesis on 05/11/2016 with removal of about 5 L of fluid.   He had a CT abd/pelvis w/wo contrast on 05/18/2016 which showed Advanced cirrhotic changes involving the liver with multiple hepatic masses most likely reflecting multifocal hepatomas. Cirrhosis with portal venous hypertension, portal venous collaterals, esophageal varices, splenomegaly and large volume abdominal/ pelvic ascites. Bilateral pleural effusions with overlying atelectasis.   Patient was referred to [facility] for consideration of liver transplant but hasnt been seen yet. He has not had complete staging workup for his liver lesions.  AS per available notes from [place] - his case was discussed in their [place] and he was not deemed to be a transplant candidate. Imaging read from [place] was suggestive of metastatic cholangiocarcinoma and there were planning to refer him for medical oncology treatments.  Notes that he had his last colonoscopy in 2014. No significant change in bowel habits recently.  Patient got admitted to the [facility] hospital this AM with weakness and fall. He had a large volume paracentesis with removal of 12 L yesterday. He also was noted to have AKI and hypercalcemia. Notes to have bacterial peritonitis and was noted to be septic with significant elevated procalcitonin levels.  He notes that he has lost about 20-30 lbs in the last several months. B/l significant abdominal distension and b/l pedal edema. Currently on pressors  MEDICAL HISTORY:  Past Medical History:  Diagnosis Date  . A-fib [facility]) '  . CAD (coronary artery disease)   . Diabetes ([place])   . HTN (hypertension)     SURGICAL HISTORY: Past Surgical History:  Procedure Laterality Date  . heart ablation    . heart stent    . quadruple bypass      SOCIAL HISTORY: Social History   Social History  . Marital status: Married    Spouse name: N/A  . Number of children: N/A  . Years of education: N/A   Occupational History  . Not on file.   Social History Main Topics  . Smoking status: Never Smoker  . Smokeless tobacco: Not on file  . Alcohol use Not on file  . Drug use: Unknown  . Sexual activity: Not on file   Other Topics Concern  . Not on file   Social History Narrative  . No narrative on file    FAMILY HISTORY: Family History  Problem Relation Age of Onset  . Heart disease Father   . Dementia Mother   . Hypertension Mother     ALLERGIES:  is allergic to penicillins and sulfur.  MEDICATIONS:  Current Facility-Administered Medications  Medication Dose Route Frequency Provider Last Rate Last Dose  .  0.9 %  sodium chloride infusion  250 mL Intravenous PRN Corey Harold, NP      . albumin human 25 % solution 100 g  100 g Intravenous Daily Wynell Balloon, RPH      . amiodarone (NEXTERONE PREMIX) 360-4.14 MG/200ML-% (1.8 mg/mL) IV infusion  30 mg/hr Intravenous Continuous Merrily Pew, MD 16.7 mL/hr at 06/01/2016 1623 30 mg/hr at 06/16/2016 1623  . aspirin chewable tablet 324 mg  324 mg Oral Once Jettie Booze, MD      . aztreonam (AZACTAM) 1 g in dextrose 5 % 50 mL IVPB  1 g Intravenous Q8H Kris Mouton, RPH   1 g at 06/07/2016 1224  . fentaNYL (SUBLIMAZE) 100 MCG/2ML injection           . fentaNYL (SUBLIMAZE) 100 MCG/2ML injection           . heparin ADULT infusion 100 units/mL (25000 units/243mL sodium chloride 0.45%)  1,400 Units/hr Intravenous Continuous Valeda Malm Rumbarger, RPH 14 mL/hr at 06/12/2016 0908 1,400 Units/hr at 06/05/2016 0908  . lactulose (CHRONULAC) 10 GM/15ML solution 20 g  20 g Oral BID Corey Harold, NP   20 g at 06/15/2016 1357  . pantoprazole (PROTONIX) injection 40 mg  40 mg Intravenous QHS Corey Harold, NP      . phenylephrine (NEO-SYNEPHRINE) 40 mg in sodium chloride 0.9 % 250 mL (0.16 mg/mL) infusion  0-400 mcg/min Intravenous Titrated Brand Males, MD 56.3 mL/hr at 06/09/2016 1618 150 mcg/min at 06/09/2016 1618  . [START ON 06/09/2016] vancomycin (VANCOCIN) 1,750 mg in sodium chloride 0.9 % 500 mL IVPB  1,750 mg Intravenous Q24H Kris Mouton, [facility]        REVIEW OF SYSTEMS:    10 Point review of Systems was done is negative except as noted above.  PHYSICAL EXAMINATION: ECOG PERFORMANCE STATUS: 3 - Symptomatic, >50% confined to bed  . Vitals:   06/02/2016 1500 06/04/2016 1600  BP: (!) 97/54 (!) 90/42  Pulse: 68 72  Resp: (!) 22 20  Temp:     There were no vitals filed for this visit. .There is no height or weight on file to calculate BMI.  GENERAL:alert, fatigued. SKIN: mildly icteric sclera. Few ecchmoisis EYES: normal, conjunctiva are pink and non-injected, sclera clear OROPHARYNX: MM dry NECK: supple, +JVD,  LYMPH:  no palpable lymphadenopathy in the cervical, axillary or inguinal LUNGS: clear to auscultation , decreased AE at b/l bases HEART: irreg, b/ pedal edema 2+ ABDOMEN: abdomen distended with fluid thrill and TTP with rebound  PSYCH: alert & oriented x 3 but forgetful. NEURO: no focal motor/sensory deficits  LABORATORY DATA:  I have reviewed  the data as listed  . CBC Latest Ref Rng & Units 06/16/2016  WBC 4.0 - 10.5 K/uL 9.9  Hemoglobin 13.0 - 17.0 g/dL 16.9  Hematocrit 39.0 - 52.0 % 49.7  Platelets 150 - 400 K/uL 246    . CMP Latest Ref Rng & Units 05/21/2016 05/18/2016 10/20/2015  Glucose 65 - 99 mg/dL 211(H) - -  BUN 6 - 20 mg/dL 62(H) - -  Creatinine 0.61 - 1.24 mg/dL 2.05(H) 0.50(L) -  Sodium 135 - 145 mmol/L 130(L) - -  Potassium 3.5 - 5.1 mmol/L 4.7 - -  Chloride 101 - 111 mmol/L 93(L) - -  CO2 22 - 32 mmol/L 21(L) - -  Calcium 8.9 - 10.3 mg/dL 12.0(H) - -  Total Protein 6.5 - 8.1 g/dL 4.7(L) - 6.0(L)  Total Bilirubin 0.3 -  1.2 mg/dL 4.5(H) - 1.1  Alkaline Phos 38 - 126 U/L 60 - 81  AST 15 - 41 U/L 97(H) - 37(H)  ALT 17 - 63 U/L 29 - 41      RADIOGRAPHIC STUDIES: I have personally reviewed the radiological images as listed and agreed with the findings in the report. Ct Abdomen Pelvis W Wo Contrast  Result Date: 05/18/2016 CLINICAL DATA:  Abdominal pain and bloating since the December 2017. History of cirrhosis. EXAM: CT ABDOMEN AND PELVIS WITHOUT AND WITH CONTRAST TECHNIQUE: Multidetector CT imaging of the abdomen and pelvis was performed following the standard protocol before and following the bolus administration of intravenous contrast. CONTRAST:  156mL ISOVUE-300 IOPAMIDOL (ISOVUE-300) INJECTION 61% COMPARISON:  05/11/2016 ultrasound. FINDINGS: Lower chest: Moderate-sized bilateral pleural effusions are noted. No pericardial effusion. Overlying bibasilar atelectasis but no infiltrates or worrisome pulmonary lesions. The heart is normal in size. Coronary artery calcifications are noted. Hepatobiliary: Advanced cirrhotic changes involving the liver with numerous irregular rim enhancing masses involving the right hepatic lobe. Findings could be due to metastatic disease or more likely multifocal hepatomas. The largest lesion on image number 33 measures 5.9 x 5.1 cm. There is also 4.9 x 4.1 cm segment 7 lesion.  Multiple other smaller lesions are also demonstrated. No intrahepatic biliary dilatation. The portal and hepatic veins are patent. The gallbladder is difficult to identified but it is present and surrounded by ascites. Few small layering gallstones are suspected. No common bile duct dilatation. Pancreas: No mass, inflammation or ductal dilatation. Spleen: Splenomegaly.  No focal lesions. Adrenals/Urinary Tract: The adrenal glands and kidneys are grossly normal. There are multiple small renal cysts. No hydronephrosis. Stomach/Bowel: Centralization of the bowel due to large volume ascites. No mass, inflammation or obstruction. Vascular/Lymphatic: Advanced atherosclerotic calcifications involving the aorta and branch vessels but no aneurysm or dissection. The major venous structures are patent. Specifically, the portal and splenic veins are patent. There are portal venous collaterals and esophageal varices noted. Reproductive: The prostate gland and seminal vesicles are unremarkable. Other: Large volume abdominal/pelvic ascites. Musculoskeletal: No significant bony findings. IMPRESSION: Advanced cirrhotic changes involving the liver with multiple hepatic masses most likely reflecting multifocal hepatomas. Cirrhosis with portal venous hypertension, portal venous collaterals, esophageal varices, splenomegaly and large volume abdominal/ pelvic ascites. Bilateral pleural effusions with overlying atelectasis. Electronically Signed   By: Marijo Sanes M.D.   On: 05/18/2016 16:47   Ct Head Wo Contrast  Result Date: 06/03/2016 CLINICAL DATA:  Recent fall, patient is on a blood thinner EXAM: CT HEAD WITHOUT CONTRAST TECHNIQUE: Contiguous axial images were obtained from the base of the skull through the vertex without intravenous contrast. COMPARISON:  None. FINDINGS: Brain: The ventricular system is normal in size and configuration for age, and the septum is in a normal midline position. The fourth ventricle and basilar  cisterns are unremarkable. No hemorrhage, mass lesion, or acute infarction is seen. Scattered dural calcifications are present. Vascular: No vascular abnormality seen on this unenhanced study. Skull: No calvarial fracture is seen. Sinuses/Orbits: The paranasal sinuses are well pneumatized. Other: None. IMPRESSION: Negative unenhanced CT of the brain. Electronically Signed   By: Ivar Drape M.D.   On: 05/22/2016 08:19   US Paracentesis  Result Date: 06/07/2016 INDICATION: Recurrent abdominal ascites secondary to hepatic cirrhosis. Request for diagnostic and therapeutic paracentesis up to 12 liters. EXAM: ULTRASOUND GUIDED RIGHT LATERAL ABDOMEN PARACENTESIS MEDICATIONS: 1% Lidocaine = 10 mL. COMPLICATIONS: None immediate. PROCEDURE: Informed written consent was obtained from the patient after  a discussion of the risks, benefits and alternatives to treatment. A timeout was performed prior to the initiation of the procedure. Initial ultrasound scanning demonstrates a large amount of ascites within the right lateral abdomen. The right lateral abdomen was prepped and draped in the usual sterile fashion. 1% lidocaine with epinephrine was used for local anesthesia. Following this, a 19 gauge, 7-cm, Yueh catheter was introduced. An ultrasound image was saved for documentation purposes. The paracentesis was performed. The catheter was removed and a dressing was applied. The patient tolerated the procedure well without immediate post procedural complication. FINDINGS: A total of approximately 12 liters of clear dark gold fluid was removed. Samples were sent to the laboratory as requested by the clinical team. IMPRESSION: Successful ultrasound-guided paracentesis yielding 12 liters of peritoneal fluid. Read by:  Gareth Eagle, PA-C Electronically Signed   By: Markus Daft M.D.   On: 06/07/2016 11:24   US Paracentesis  Result Date: 05/11/2016 INDICATION: Patient with a history of cirrhosis and abdominal ascites. Request is  made for diagnostic and therapeutic paracentesis. EXAM: ULTRASOUND GUIDED DIAGNOSTIC AND THERAPEUTIC PARACENTESIS MEDICATIONS: 1% lidocaine COMPLICATIONS: None immediate. PROCEDURE: Informed written consent was obtained from the patient after a discussion of the risks, benefits and alternatives to treatment. A timeout was performed prior to the initiation of the procedure. Initial ultrasound scanning demonstrates a large amount of ascites within the left lower abdominal quadrant. The left lower abdomen was prepped and draped in the usual sterile fashion. 1% lidocaine was used for local anesthesia. Following this, a 19 gauge, 10 -cm, Yueh catheter was introduced. An ultrasound image was saved for documentation purposes. The paracentesis was performed. The catheter was removed and a dressing was applied. The patient tolerated the procedure well without immediate post procedural complication. FINDINGS: A total of approximately 5 L of yellow fluid was removed. Samples were sent to the laboratory as requested by the clinical team. IMPRESSION: Successful ultrasound-guided paracentesis yielding 5 liters of peritoneal fluid. A 5 L maximum was given. The patient did go to short-stay and received 50 g of albumin as well. Read by: Saverio Danker, PA-C Electronically Signed   By: Jerilynn Mages.  Shick M.D.   On: 05/11/2016 12:13   CT body outside interpretation2/11/2016 [place] Result Narrative   **INTERPRETATION OF OUTSIDE IMAGING**  INDICATIONS: [place]  COMPARISONS: None   TECHNIQUE: - Patient full name: Stephen Marquez - Date of Original Study: CT the abdomen and pelvis from 05/18/2016 - Study: CT of the abdomen and pelvis from 05/18/2016 - Study Technique: Axial CT images of the abdomen and pelvis were obtained precontrast, arterial phase, portal venous phase, and delayed phase. Oral contrast was administered. Coronal and sagittal reformats were performed.  FINDINGS:   Liver:Liver is nodular in  contour, consistent with patient's history of cirrhosis.  Focal hepatic lesions:  Observation 1: There are multiple (greater than 10) lesions in the liver. There are lesions in both the right and left hepatic lobes. Largest lesion is in segment 4A/8 of the liver, measuring approximately 4.9 x 4.1 cm. This lesion has peripheral hyperenhancement, however lesion is predominantly hypoenhancing on arterial and portal venous phase images. Additional lesions are also hypoenhancing on arterial and venous phase images, and larger lesions also have peripheral hyperenhancement. Of note, delayed phase images exclude the majority of the liver, which limits the ability to characterize lesions completely. Given the capsular retraction of the largest lesion and that lesions are primarily hypoenhancing, this raises the concern for multifocal cholangiocarcinoma. LI-RADS category  M.  Complications of chronic liver disease:Splenomegaly, spleen measures up to 16.9 cm. Perisplenic and periesophageal varices are noted. Large volume ascites. Hepatic vascular anatomy:Portal vein is patent, hepatic veins are patent. Conventional hepatic arterial anatomy. Biliary system:No intrahepatic or extra hepatic biliary ductal dilation. Gallbladder is unremarkable.  Other organs, findings, etc.:Adrenals, pancreas are unremarkable. Abdominal aorta is normal in caliber. No pathologic adenopathy. Bilateral renal cysts. Some hypodensities are too small to characterize. No hydronephrosis or nephrolithiasis. Bowel loops normal in caliber. Bladder is decompressed, which limits evaluation. Fat-containing right inguinal hernia. Anasarca. No aggressive appearing osseous lesion.  Impression: Hepatic findings: Multiple (greater than 10) liver lesions, which are poorly characterized given that the delayed phase images excludes the majority of the liver. Lesions are predominantly hypoenhancing, without washout.  Largest lesion has capsular retraction. Multifocal cholangiocarcinoma is a consideration, LI-RADS category M.Recommend correlation with tumor markers and further characterization with MRI after paracentesis.  Extrahepatic findings: Sequelae of portal hypertension as above.    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 With significant ascites -- Cytology negative for malignancy No biopsy/tissue diagnosis available at this time. Plan -was to be seen at [facility] -based on the extent of liver lesions and b/l distribution he does not appear to be a good candidate for liver transplantation. -will get AFP tumor marker, CA 19-9 and CEA levels -once SBP uncontrol with abx and based on goals of care will need staging imaging including MRI/MRCP of liver (if renal function okay), CT chest wo contrast and Bone scan and consideration of biopsy cased on imaging. -discussion regarding best supportive care vs palliative chemotherapy would be based on patient performance status and goals of care. -based on currently available information if the patient remains critically ill with difficulty bouncing back from his sepsis --consulting palliative care and consideration of a Best supportive care approach would not be unreasonable.  -Available information thus far was discussed with the patient , wife and son at bedside.  2) Advanced Liver cirrhosis with SBP. Portal, HTN, esophageal varices, splenomegaly, ascites etc 3) SBP 4) AKI - multifactorial - hepatorenal syndrome, contrast, large volume paracentesis, sepsis, hypotension (currently on pressors), hypercalcemia 5) Hypercalcemia - could certainly be paraneoplastic (HCC and cholangiocarcinomas have both been reported to produce paraneoplastic hypercalcemia even without direct bone involvement). Plan -mx per PCCM -may consider IV PAmidronate 60mg  over  4 hours for hypercalcemia   We shall continue to follow peripherally. Kindly let us know if any new questions or concerns arise  All of the patients questions were answered with apparent satisfaction. The patient knows to call the clinic with any problems, questions or concerns.  I spent 80 minutes counseling the patient face to face. The total time spent in the appointment was 110 minutes and more than 50% was on counseling and direct patient cares.    Sullivan Lone MD [place] AAHIVMS [facility] Hematology/Oncology Physician [facility]  (Office):       [phone] (Work cell):  [phone] (Fax):           [phone]  05/24/2016 4:37 PM

## 2016-06-08 NOTE — Consult Note (Signed)
Referring Provider:  Dr. Chase Caller  Primary Care Physician:  Melinda Crutch, MD Primary Gastroenterologist:  Dr. 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 male known to me from recent multiple office visits. Patient was initially seen for diarrhea. Patient was found to have low platelet counts. Ultrasound was ordered which showed cirrhosis. Underwent EGD which showed small esophageal varices. Patient subsequently developed rapidly worsening ascites. Initial paracentesis was performed with 5 L of fluid removed but unfortunately despite of being ordered fluid analysis was not done. Patient subsequently had a CT scan for rapidly worsening ascites which revealed multiple liver lesions concerning for multiple hepatomas. Patient was referred to [facility] as well as oncology. .Patient was  seen in office visit few days ago. Blood work at that time showed normal kidney function. Normal AFP. Mild elevated alkaline phosphatase was noted. Patient noticed nausea and vomiting during the weekend. It improved with Zofran. Patient subsequently had paracenteses yesterday with 12 L of fluid removed. I did call patient yesterday and he was doing fine after paracentesis. He was able to keep liquid food down. I did discuss fluid analysis results with him.Offered  inpatient admission to treat SBP. Patient requested outpatient antibiotics. Antibiotics were prescribed yesterday. This morning patient was feeling weak and had a fall.  Wife called EMS and upon initial evaluation patient was found to have abnormal EKG consistent with acute coronary syndrome.   Asian seen and examined. History obtained with the help of wife. Patient is somewhat lethargic. Able to give limited history. Complaining of right-sided abdominal pain. No bowel movement since Friday. Had nausea and vomiting during weekend which is improving. Denied any blood in the stool or black  stool. Denied any blood in the vomiting.  Blood work in office on 06/02/2016 showed Cr 0.59, T Bili 1.6 and Alk phos 214 with normal AST and ALT. AFP 1.6.  CBC on 05/04/2016 was normal Except for PLT of 111. PT 12.6 and INR 1.2 on 05/04/2016    Past Medical History:  Diagnosis Date  . A-fib [facility]) '  . CAD (coronary artery disease)   . Diabetes ([place])   . HTN (hypertension)     Past Surgical History:  Procedure Laterality Date  . heart ablation    . heart stent    . quadruple bypass      Prior to Admission medications   Medication Sig Start Date End Date Taking? Authorizing Provider  aspirin 81 MG tablet Take 81 mg by mouth daily.    Historical Provider, MD  atorvastatin (LIPITOR) 40 MG tablet Take 1 tablet (40 mg total) by mouth daily. 01/02/16   Jettie Booze, MD  Canagliflozin (INVOKANA PO) Take 100 mg by mouth.     Historical Provider, MD  clopidogrel (PLAVIX) 75 MG tablet Take 1 tablet by mouth daily. 01/25/13   Historical Provider, MD  Famotidine 20 MG CHEW Chew by mouth 2 (two) times daily.    Historical Provider, MD  Fenofibrate 150 MG CAPS Take 1 tablet by mouth daily. 01/25/13   Historical Provider, MD  Icosapent Ethyl (VASCEPA) 1 G CAPS 2 capsules twice a day 03/18/14   Jettie Booze, MD  isosorbide mononitrate (IMDUR) 60 MG 24 hr tablet Take 1 tablet by mouth daily. 01/25/13   Historical Provider, MD  Lancet Devices (ONE TOUCH DELICA LANCING DEV) MISC As directed 03/02/13   Historical Provider, MD  losartan (COZAAR) 25 MG tablet Take 1  tablet by mouth daily. 02/27/13   Historical Provider, MD  metFORMIN (GLUCOPHAGE) 1000 MG tablet Take 2 tablets by mouth 2 (two) times daily. 01/25/13   Historical Provider, MD  metoprolol (LOPRESSOR) 100 MG tablet Take 1 tablet by mouth 2 (two) times daily.  01/25/13   Historical Provider, MD  Multiple Vitamins-Minerals (MULTIVITAMIN PO) Take by mouth daily.    Historical Provider, MD  niacin 500 MG tablet Take 1,500 mg by mouth 2  (two) times daily with a meal.     Historical Provider, MD  nitroGLYCERIN (NITROSTAT) 0.4 MG SL tablet Place 1 tablet (0.4 mg total) under the tongue every 5 (five) minutes as needed for chest pain (3 DOSES MAX). 11/20/15   Jettie Booze, MD  ONE TOUCH ULTRA TEST test strip As directed 02/27/13   Historical Provider, MD  Jonetta Speak LANCETS FINE MISC As directed 02/27/13   Historical Provider, MD  pioglitazone (ACTOS) 15 MG tablet Take 1 tablet by mouth daily.  01/25/13   Historical Provider, MD    Scheduled Meds: . aspirin  324 mg Oral Once  . aztreonam  1 g Intravenous Q8H  . fentaNYL      . fentaNYL      . pantoprazole (PROTONIX) IV  40 mg Intravenous QHS  . [START ON 06/09/2016] vancomycin  1,750 mg Intravenous Q24H  . vancomycin  2,500 mg Intravenous Once   Continuous Infusions: . amiodarone 60 mg/hr (06/07/2016 1000)   Followed by  . amiodarone    . heparin 1,400 Units/hr (06/06/2016 0908)  . phenylephrine (NEO-SYNEPHRINE) Adult infusion 130 mcg/min (06/13/2016 1128)   PRN Meds:.sodium chloride  Allergies as of 06/05/2016 - Review Complete 05/31/2016  Allergen Reaction Noted  . Penicillins Rash 10/15/2013  . Sulfur Swelling 10/15/2013    Family History  Problem Relation Age of Onset  . Heart disease Father   . Dementia Mother   . Hypertension Mother     Social History   Social History  . Marital status: Married    Spouse name: N/A  . Number of children: N/A  . Years of education: N/A   Occupational History  . Not on file.   Social History Main Topics  . Smoking status: Never Smoker  . Smokeless tobacco: Not on file  . Alcohol use Not on file  . Drug use: Unknown  . Sexual activity: Not on file   Other Topics Concern  . Not on file   Social History Narrative  . No narrative on file    Review of Systems: patient is currently lethargic and sleepy. ROS not obtained   Physical Exam: Vital signs: Vitals:   06/15/2016 1118 05/20/2016 1124  BP: (!) 80/52  (!) 91/53  Pulse: 72 71  Resp: 23 22  Temp:       General:   Somewhat lethargic and sleepy. Able to give limited history. HEENT : NS, AT  Eyes. Extraocular movement  intact. Scleral icterus noted. Lungs:  Fine bibasilar crackles. Heart:  Regular rate and rhythm; no murmurs, clicks, rubs,  or gallops. Abdomen: Distended. Right-sided periumbilical tenderness to palpation. Bowel sounds present. No rebound tenderness. LE : 1 + edema. No cyanosis  Psych : Alert and oriented 3 but somewhat sleepy. Flat affect at this time  GI:  Lab Results:  Recent Labs  06/04/2016 0729  WBC 9.9  HGB 16.9  HCT 49.7  PLT 246   BMET  Recent Labs  05/28/2016 0729  NA 130*  K 4.7  CL 93*  CO2 21*  GLUCOSE 211*  BUN 62*  CREATININE 2.05*  CALCIUM 12.0*   LFT  Recent Labs  06/14/2016 0729  PROT 4.7*  ALBUMIN 2.5*  AST 97*  ALT 29  ALKPHOS 60  BILITOT 4.5*   PT/INR  Recent Labs  06/06/2016 0729  LABPROT 22.1*  INR 1.91     Studies/Results: Ct Head Wo Contrast  Result Date: 06/07/2016 CLINICAL DATA:  Recent fall, patient is on a blood thinner EXAM: CT HEAD WITHOUT CONTRAST TECHNIQUE: Contiguous axial images were obtained from the base of the skull through the vertex without intravenous contrast. COMPARISON:  None. FINDINGS: Brain: The ventricular system is normal in size and configuration for age, and the septum is in a normal midline position. The fourth ventricle and basilar cisterns are unremarkable. No hemorrhage, mass lesion, or acute infarction is seen. Scattered dural calcifications are present. Vascular: No vascular abnormality seen on this unenhanced study. Skull: No calvarial fracture is seen. Sinuses/Orbits: The paranasal sinuses are well pneumatized. Other: None. IMPRESSION: Negative unenhanced CT of the brain. Electronically Signed   By: Ivar Drape M.D.   On: 05/24/2016 08:19   US Paracentesis  Result Date: 06/07/2016 INDICATION: Recurrent abdominal ascites secondary to  hepatic cirrhosis. Request for diagnostic and therapeutic paracentesis up to 12 liters. EXAM: ULTRASOUND GUIDED RIGHT LATERAL ABDOMEN PARACENTESIS MEDICATIONS: 1% Lidocaine = 10 mL. COMPLICATIONS: None immediate. PROCEDURE: Informed written consent was obtained from the patient after a discussion of the risks, benefits and alternatives to treatment. A timeout was performed prior to the initiation of the procedure. Initial ultrasound scanning demonstrates a large amount of ascites within the right lateral abdomen. The right lateral abdomen was prepped and draped in the usual sterile fashion. 1% lidocaine with epinephrine was used for local anesthesia. Following this, a 19 gauge, 7-cm, Yueh catheter was introduced. An ultrasound image was saved for documentation purposes. The paracentesis was performed. The catheter was removed and a dressing was applied. The patient tolerated the procedure well without immediate post procedural complication. FINDINGS: A total of approximately 12 liters of clear dark gold fluid was removed. Samples were sent to the laboratory as requested by the clinical team. IMPRESSION: Successful ultrasound-guided paracentesis yielding 12 liters of peritoneal fluid. Read by:  Gareth Eagle, PA-C Electronically Signed   By: Markus Daft M.D.   On: 06/07/2016 11:24    Impression/Plan:   - Septic shock -Ascites fluid analysis yesterday showing SBP  - Acute coronary syndrome - Refractory ascites with SBP. 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 Normal AFP on 06/02/2016. - Acute kidney injury. Creatinine was 0.59 on 06/02/2016. Probably ischemic injury from hypertension. -??  Hepatic encephalopathy. Patient feels somewhat lethargic  Recommendations ---------------------------- - Antibiotics and pressor support per critical care team. - Ascites fluid growing gram-positive cocci /final culture pending. Follow  blood culture. - Continue lactulose. If remains lethargic May need to add rifaximin. -  albumin infusion 100 mg Qday for 3 days starting today  -  Hold off on diuretics. Hold off on further large volume paracentesis. - Monitor kidney function and liver tests. Monitor INR. 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 - Patient has been referred to [facility] for further evaluation as an outpatient basis. He is scheduled for oncology consultation on 06/29/2016 at [facility]. - May consider nephrology consult if no improvement in kidney function - Prognosis is poor. - GI will follow.  LOS: 0 days   Otis Brace  MD, FACP 05/31/2016, 12:06 PM  Pager [phone] If no answer or after 5 PM call [phone]

## 2016-06-08 NOTE — ED Notes (Signed)
Trop 0.06 , Dr Dayna Barker notified.

## 2016-06-08 NOTE — Care Management Note (Signed)
Case Management Note  Patient Details  Name: [name] MRN: [id number] Date of Birth: [birth year]  Subjective/Objective:      Adm w fever              Action/Plan: lives w wife, pcp dr Lona Kettle   Expected Discharge Date:                  Expected Discharge Plan:  Home/Self Care  In-House Referral:     Discharge planning Services     Post Acute Care Choice:    Choice offered to:     DME Arranged:    DME Agency:     HH Arranged:    [place] Agency:     Status of Service:  In process, will continue to follow  If discussed at Long Length of Stay Meetings, dates discussed:    Additional Comments: will moniter for dc pl as pt progresses.  Lacretia Leigh, RN 06/07/2016, 11:58 AM

## 2016-06-08 NOTE — H&P (Signed)
PULMONARY / CRITICAL CARE MEDICINE   Name: Stephen Marquez MRN: [id number] DOB: 1955-11-11    ADMISSION DATE:  05/31/2016 CONSULTATION DATE:  06/11/2016  REFERRING MD:  Dr. Dayna Barker EDP  CHIEF COMPLAINT:  Syncope  HISTORY OF PRESENT ILLNESS:  History gained largely from wife as patient is encephalopathic. 61 year old male with past medical history as below, which is significant for coronary artery disease status post CABG x4 in 1999, DM2, and atrial fibrillaiton status post ablation. In August 2017 he was found have elevated liver function testing, and since has progressed to cirrhosis. He is being followed at [facility] for this and consideration for transplant. Recent CT scan 2/8 at [facility] demonstrated multiple liver lesions and a gastroenterology is highly concerned for widely metastatic cholangiocarcinoma after discussion with multidisciplinary team 2/13.   Starting 2/17 he was experiencing vomiting and has been unable to keep food down. He then underwent paracentesis 2/19 and had 12L removed. Vomiting lead to progressive weakness and he suffered a syncopal episode 2/20 AM and he was brought by EMS to the ED. Initial EKG demonstrated STEMI, however troponin normal. He was evaluated by cardiology who feels as though he is probably having acute inferior MI, but EKG improved. Felt that risks of cath outweigh benefits given his acute and chronic illness.   He was also found to be hypotensive despite albumin administration and 3L IVF. He was started on Levophed for BP support.   PAST MEDICAL HISTORY :  He  has a past medical history of A-fib ([place]) ('); CAD (coronary artery disease); Diabetes ([place]); and HTN (hypertension).  PAST SURGICAL HISTORY: He  has a past surgical history that includes quadruple bypass; heart stent; and heart ablation.  Allergies  Allergen Reactions  . Penicillins Rash  . Sulfur Swelling    No current facility-administered medications on file prior to encounter.     Current Outpatient Prescriptions on File Prior to Encounter  Medication Sig  . aspirin 81 MG tablet Take 81 mg by mouth daily.  Marland Kitchen atorvastatin (LIPITOR) 40 MG tablet Take 1 tablet (40 mg total) by mouth daily.  . Canagliflozin (INVOKANA PO) Take 100 mg by mouth.   . clopidogrel (PLAVIX) 75 MG tablet Take 1 tablet by mouth daily.  . Famotidine 20 MG CHEW Chew by mouth 2 (two) times daily.  . Fenofibrate 150 MG CAPS Take 1 tablet by mouth daily.  [name] Ethyl (VASCEPA) 1 G CAPS 2 capsules twice a day  . isosorbide mononitrate (IMDUR) 60 MG 24 hr tablet Take 1 tablet by mouth daily.  Elmore Guise Devices (ONE TOUCH DELICA LANCING DEV) MISC As directed  . losartan (COZAAR) 25 MG tablet Take 1 tablet by mouth daily.  . metFORMIN (GLUCOPHAGE) 1000 MG tablet Take 2 tablets by mouth 2 (two) times daily.  . metoprolol (LOPRESSOR) 100 MG tablet Take 1 tablet by mouth 2 (two) times daily.   . Multiple Vitamins-Minerals (MULTIVITAMIN PO) Take by mouth daily.  . niacin 500 MG tablet Take 1,500 mg by mouth 2 (two) times daily with a meal.   . nitroGLYCERIN (NITROSTAT) 0.4 MG SL tablet Place 1 tablet (0.4 mg total) under the tongue every 5 (five) minutes as needed for chest pain (3 DOSES MAX).  . ONE TOUCH ULTRA TEST test strip As directed  . ONETOUCH DELICA LANCETS FINE MISC As directed  . pioglitazone (ACTOS) 15 MG tablet Take 1 tablet by mouth daily.     FAMILY HISTORY:  His indicated that the status of his  mother is unknown. He indicated that the status of his father is unknown.    SOCIAL HISTORY: He  reports that he has never smoked. He does not have any smokeless tobacco history on file.  REVIEW OF SYSTEMS:   Review of Systems:   Bolds are positive  Constitutional: weight loss, gain, night sweats, Fevers, chills, fatigue .  HEENT: headaches, Sore throat, sneezing, nasal congestion, post nasal drip, Difficulty swallowing, Tooth/dental problems, visual complaints visual changes, ear  ache CV:  chest pain, radiates:,Orthopnea, PND, swelling in lower extremities, dizziness, palpitations, syncope.  GI  heartburn, indigestion, abdominal pain, nausea, vomiting, diarrhea, change in bowel habits, loss of appetite, bloody stools.  Resp: cough, productive: , hemoptysis, dyspnea, chest pain, pleuritic.  Skin: rash or itching or icterus GU: dysuria, change in color of urine, urgency or frequency. flank pain, hematuria  MS: joint pain or swelling. decreased range of motion  Psych: change in mood or affect. depression or anxiety.  Neuro: difficulty with speech, weakness, numbness, ataxia    SUBJECTIVE:    VITAL SIGNS: BP 91/60   Pulse 85   Temp 99.5 F (37.5 C) (Rectal)   Resp (!) 28   SpO2 96%   HEMODYNAMICS:    VENTILATOR SETTINGS:    INTAKE / OUTPUT: No intake/output data recorded.  PHYSICAL EXAMINATION: General:  Pale, jaundiced male in mild resp distress Neuro:  Alert, oriented, mild confusion HEENT:  [place]/AT, PERRL, no JVD Cardiovascular:  RRR, no MRG. Some runs VT Lungs:  Clear Abdomen:  Protuberant, fluctuant. Tender Musculoskeletal:  Soft, non-tender, non-distended Skin:  Jaundiced, intact  LABS:  BMET  Recent Labs Lab 06/04/2016 0729  NA 130*  K 4.7  CL 93*  CO2 21*  BUN 62*  CREATININE 2.05*  GLUCOSE 211*    Electrolytes  Recent Labs Lab 06/09/2016 0729  CALCIUM 12.0*    CBC  Recent Labs Lab 06/09/2016 0729  WBC 9.9  HGB 16.9  HCT 49.7  PLT 246    Coag's  Recent Labs Lab 06/04/2016 0729  APTT 35  INR 1.91    Sepsis Markers  Recent Labs Lab 05/23/2016 0749  LATICACIDVEN 4.9*    ABG No results for input(s): PHART, PCO2ART, PO2ART in the last 168 hours.  Liver Enzymes  Recent Labs Lab 05/27/2016 0729  AST 97*  ALT 29  ALKPHOS 60  BILITOT 4.5*  ALBUMIN 2.5*    Cardiac Enzymes  Recent Labs Lab 06/09/2016 0729  TROPONINI 0.06*    Glucose No results for input(s): GLUCAP in the last 168  hours.  Imaging Ct Head Wo Contrast  Result Date: 05/20/2016 CLINICAL DATA:  Recent fall, patient is on a blood thinner EXAM: CT HEAD WITHOUT CONTRAST TECHNIQUE: Contiguous axial images were obtained from the base of the skull through the vertex without intravenous contrast. COMPARISON:  None. FINDINGS: Brain: The ventricular system is normal in size and configuration for age, and the septum is in a normal midline position. The fourth ventricle and basilar cisterns are unremarkable. No hemorrhage, mass lesion, or acute infarction is seen. Scattered dural calcifications are present. Vascular: No vascular abnormality seen on this unenhanced study. Skull: No calvarial fracture is seen. Sinuses/Orbits: The paranasal sinuses are well pneumatized. Other: None. IMPRESSION: Negative unenhanced CT of the brain. Electronically Signed   By: Ivar Drape M.D.   On: 05/28/2016 08:19   US Paracentesis  Result Date: 06/07/2016 INDICATION: Recurrent abdominal ascites secondary to hepatic cirrhosis. Request for diagnostic and therapeutic paracentesis up to 12 liters. EXAM:  ULTRASOUND GUIDED RIGHT LATERAL ABDOMEN PARACENTESIS MEDICATIONS: 1% Lidocaine = 10 mL. COMPLICATIONS: None immediate. PROCEDURE: Informed written consent was obtained from the patient after a discussion of the risks, benefits and alternatives to treatment. A timeout was performed prior to the initiation of the procedure. Initial ultrasound scanning demonstrates a large amount of ascites within the right lateral abdomen. The right lateral abdomen was prepped and draped in the usual sterile fashion. 1% lidocaine with epinephrine was used for local anesthesia. Following this, a 19 gauge, 7-cm, Yueh catheter was introduced. An ultrasound image was saved for documentation purposes. The paracentesis was performed. The catheter was removed and a dressing was applied. The patient tolerated the procedure well without immediate post procedural complication.  FINDINGS: A total of approximately 12 liters of clear dark gold fluid was removed. Samples were sent to the laboratory as requested by the clinical team. IMPRESSION: Successful ultrasound-guided paracentesis yielding 12 liters of peritoneal fluid. Read by:  Gareth Eagle, PA-C Electronically Signed   By: Markus Daft M.D.   On: 06/07/2016 11:24     STUDIES:  CT head 2/20 >neg  CULTURES: Blood 2/20 > Peritoneal 2/19 > GCP chains/pairs  ANTIBIOTICS: Vancomycin 2/20 > Zosyn 2/20 > Cipro 2/19 > 2/20  SIGNIFICANT EVENTS:   LINES/TUBES: Femoral CVL 2/20 >  DISCUSSION:   ASSESSMENT / PLAN:  PULMONARY A: No acute issues  P:   Supplemental O2 PRN Incentive spirometry  CARDIOVASCULAR A:  Shock: multifactorial due to sepsis, hypovolemia, and third spacing of fluids Acute MI CAD s/p CABG 1990s   P:  Telemetry Cards following Neo for BP support as was having runs VT while on Levo, unclear if related but switched to neo Amiodarone S/p 3L IVF and albumin Ensure lactic clearing Holding home Imdur, metoprolol, losartan  RENAL A:   AKI: azotemia vs hepatorenal Hyponatremia  P:   Hydrate Follow BMP and urine output  GASTROINTESTINAL A:   NASH cirrhosis GI at [place] suspects widely metastatic cholangiocarcinoma.  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

## 2016-06-08 NOTE — Progress Notes (Signed)
Pharmacy Antibiotic Note  BRANDTLEY LINSON is a 61 y.o. male with septic shock and peritonitis. Pharmcy consulted to dose vancomycin and azactam. -SCr= 2.05 (last was 0.5 on 05/18/16), normalized CrCl ~ 40 -WBC= 9.9, afeb, LA= 4.4 -peritoneal fluid cultures- GPC pairs, GPR    Plan: -Vancomycin 2500mg  IV x1 followed by 1500mg  IV q24hr -Azactam 1gmIV q8h -Will follow renal function, cultures and clinical progress   Temp (24hrs), Avg:99.2 F (37.3 C), Min:98.8 F (37.1 C), Max:99.5 F (37.5 C)   Recent Labs Lab 06/07/2016 0729 06/05/2016 0749  WBC 9.9  --   CREATININE 2.05*  --   LATICACIDVEN  --  4.9*    Estimated Creatinine Clearance: 56.7 mL/min (by C-G formula based on SCr of 2.05 mg/dL (H)).    Allergies  Allergen Reactions  . Penicillins Rash  . Sulfur Swelling    Antimicrobials this admission: 2/20 azac>> 2/20 vanc>> 2/20 cipro/flagyl x1  Dose adjustments this admission:   Microbiology results: 2/20 blood x2 2/19 peritoneal fluid- GPC pairs, GPR   Thank you for allowing pharmacy to be a part of this patient's care.  Hildred Laser, Pharm D 06/03/2016 11:58 AM

## 2016-06-08 NOTE — ED Notes (Signed)
Lactic acid 4.9 Dr. Dayna Barker notified.

## 2016-06-08 NOTE — ED Notes (Signed)
Per Dr. Dayna Barker, okay to use central line

## 2016-06-08 NOTE — ED Notes (Signed)
Critical care at bedside  

## 2016-06-08 NOTE — ED Notes (Signed)
Per Dr. Dayna Barker, hold heparin until after blood results and CT results

## 2016-06-08 NOTE — ED Notes (Signed)
Pt abdomen very distended

## 2016-06-08 NOTE — ED Notes (Signed)
Cards at bedside

## 2016-06-08 NOTE — ED Notes (Signed)
EKGs given to Dr. Dayna Barker

## 2016-06-08 NOTE — ED Notes (Signed)
Pt taken to CT and brought back by this nurse, no adverse events.

## 2016-06-08 NOTE — ED Notes (Signed)
Dr. Dayna Barker at bedside getting ready for central line

## 2016-06-08 NOTE — H&P (Addendum)
STAFF NOTE: I, Dr Ann Lions have personally reviewed patient's available data, including medical history, events of note, physical examination and test results as part of my evaluation. I have discussed with resident/NP and other care providers such as pharmacist, RN and RRT.  In addition,  I personally evaluated patient and elicited key findings of   S: 85 year ol who had CABG in the 1990s. Known cirrhosis - persumed NASH follos with Dr Genevieve Norlander [facility] @ [address] . Originally from [place]. CT abd 05/18/16 in our system with liver mets.  On 06/01/16 [facility] onc notes at [facility] dx with widely metastatic cholangiocarcinoma and plans to set him up for fu at oncology clinic (unclear if patient/wife know dx)  Per GI primary Dr Luan Pulling: 2/19.18 had SBP with 470 wbc, 93% PMN c./w SBP. Then  Weak and fell . EKG 06/05/2016\ c/w Inferolateral MI. Also wuit AKI 2.0mg % Rx heparin by cards and given medical issues no cath. In ER also hypotensive needing levophed that resulted in V tach (switched to neo) and also amio for A Fib RVR. Alert and oritend but not intubated  O: Cirrhotic abdomen Alert and oriented x3 Edema +   Recent Labs Lab 05/30/2016 0729  HGB 16.9  HCT 49.7  WBC 9.9  PLT 246    Recent Labs Lab 06/14/2016 0729  NA 130*  K 4.7  CL 93*  CO2 21*  GLUCOSE 211*  BUN 62*  CREATININE 2.05*  CALCIUM 12.0*    Recent Labs Lab 06/05/2016 0749  LATICACIDVEN 4.9*    Recent Labs Lab 05/29/2016 0729  AST 97*  ALT 29  ALKPHOS 60  BILITOT 4.5*  PROT 4.7*  ALBUMIN 2.5*  INR 1.91   Results for Stephen Marquez, Stephen Marquez (MRN [id number]) as of 05/20/2016 11:23  Ref. 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 Cirrhosis with large ascites Criculatory shock due to above AKI - due to above, Concern for hepatorenal New Acute MI - sTEMI - conservative Rx only At risk for GI bleed due to above ; currently none  New dx of cholangiocarcinoma metastatic- patient only knows he has cancer in liver and workup in progress  High risk for death this admit  P: Neo for presors Lactulose for Hep encph prophylasix Keep MAP at goal > 65 I vheparin for MI - cath not possible Broad antibiotics PPI bid GI consult - called - d.w Dr Luan Pulling - he will call Dr Amedeo Plenty Onc consult - APP will call  Code status and goals need sorting out; currently full  .  Rest per NP/medical resident whose note is outlined above and that I agree with  The patient is critically ill with multiple organ systems failure and requires high complexity decision making for assessment and support, frequent evaluation and titration of therapies, application of advanced monitoring technologies and extensive interpretation of multiple databases.   Critical Care Time devoted to patient care services described in this note is  45  Minutes. This time reflects  time of care of this signee Dr Brand Males. This critical care time does not reflect procedure time, or teaching time or supervisory time of PA/NP/Med student/Med Resident etc but could involve care discussion time    Dr. Brand Males, M.D., [facility].C.P Pulmonary and Critical Care Medicine Staff Physician [facility] Pager: [phone], If no answer or between  15:00h - 7:00h: call [phone]  05/27/2016 11:23 AM

## 2016-06-09 ENCOUNTER — Inpatient Hospital Stay (HOSPITAL_COMMUNITY): Payer: 59

## 2016-06-09 DIAGNOSIS — K652 Spontaneous bacterial peritonitis: Secondary | ICD-10-CM

## 2016-06-09 DIAGNOSIS — I472 Ventricular tachycardia, unspecified: Secondary | ICD-10-CM

## 2016-06-09 DIAGNOSIS — I2111 ST elevation (STEMI) myocardial infarction involving right coronary artery: Secondary | ICD-10-CM

## 2016-06-09 LAB — GLUCOSE, CAPILLARY
GLUCOSE-CAPILLARY: 140 mg/dL — AB (ref 65–99)
GLUCOSE-CAPILLARY: 122 mg/dL — AB (ref 65–99)
Glucose-Capillary: 118 mg/dL — ABNORMAL HIGH (ref 65–99)
Glucose-Capillary: 133 mg/dL — ABNORMAL HIGH (ref 65–99)
Glucose-Capillary: 210 mg/dL — ABNORMAL HIGH (ref 65–99)
Glucose-Capillary: 221 mg/dL — ABNORMAL HIGH (ref 65–99)

## 2016-06-09 LAB — BASIC METABOLIC PANEL
Anion gap: 14 (ref 5–15)
BUN: 61 mg/dL — AB (ref 6–20)
CALCIUM: 11.1 mg/dL — AB (ref 8.9–10.3)
CO2: 20 mmol/L — AB (ref 22–32)
CREATININE: 2.72 mg/dL — AB (ref 0.61–1.24)
Chloride: 95 mmol/L — ABNORMAL LOW (ref 101–111)
GFR calc Af Amer: 28 mL/min — ABNORMAL LOW (ref >60)
GFR, EST NON AFRICAN AMERICAN: 24 mL/min — AB (ref >60)
GLUCOSE: 168 mg/dL — AB (ref 65–99)
Potassium: 4.7 mmol/L (ref 3.5–5.1)
Sodium: 129 mmol/L — ABNORMAL LOW (ref 135–145)

## 2016-06-09 LAB — CBC
HCT: 45 % (ref 39.0–52.0)
HEMOGLOBIN: 14.8 g/dL (ref 13.0–17.0)
MCH: 29.4 pg (ref 26.0–34.0)
MCHC: 32.9 g/dL (ref 30.0–36.0)
MCV: 89.5 fL (ref 78.0–100.0)
PLATELETS: 216 10*3/uL (ref 150–400)
RBC: 5.03 MIL/uL (ref 4.22–5.81)
RDW: 15.6 % — AB (ref 11.5–15.5)
WBC: 14.4 10*3/uL — ABNORMAL HIGH (ref 4.0–10.5)

## 2016-06-09 LAB — PROTIME-INR
INR: 2.27
Prothrombin Time: 25.4 seconds — ABNORMAL HIGH (ref 11.4–15.2)

## 2016-06-09 LAB — MAGNESIUM: MAGNESIUM: 2 mg/dL (ref 1.7–2.4)

## 2016-06-09 LAB — HIV ANTIBODY (ROUTINE TESTING W REFLEX): HIV SCREEN 4TH GENERATION: NONREACTIVE

## 2016-06-09 LAB — PHOSPHORUS: PHOSPHORUS: 4.8 mg/dL — AB (ref 2.5–4.6)

## 2016-06-09 LAB — HEPATIC FUNCTION PANEL
ALK PHOS: 60 U/L (ref 38–126)
ALT: 125 U/L — ABNORMAL HIGH (ref 17–63)
AST: 418 U/L — ABNORMAL HIGH (ref 15–41)
Albumin: 3.3 g/dL — ABNORMAL LOW (ref 3.5–5.0)
Bilirubin, Direct: 2.7 mg/dL — ABNORMAL HIGH (ref 0.1–0.5)
Indirect Bilirubin: 1.6 mg/dL — ABNORMAL HIGH (ref 0.3–0.9)
TOTAL PROTEIN: 5 g/dL — AB (ref 6.5–8.1)
Total Bilirubin: 4.3 mg/dL — ABNORMAL HIGH (ref 0.3–1.2)

## 2016-06-09 LAB — HEPARIN LEVEL (UNFRACTIONATED): Heparin Unfractionated: 0.21 IU/mL — ABNORMAL LOW (ref 0.30–0.70)

## 2016-06-09 LAB — TROPONIN I: Troponin I: 19.26 ng/mL (ref <0.03)

## 2016-06-09 LAB — CORTISOL: Cortisol, Plasma: >100 ug/dL

## 2016-06-09 MED ORDER — PANTOPRAZOLE SODIUM 40 MG PO PACK: 40.0000 mg | PACK | Freq: Every day | ORAL | Status: DC

## 2016-06-09 MED ORDER — ALBUMIN HUMAN 5 % IV SOLN
12.5000 g | Freq: Three times a day (TID) | INTRAVENOUS | Status: DC
  Administered 2016-06-09 (×2): 12.5 g via INTRAVENOUS
  Filled 2016-06-09 (×3): qty 250

## 2016-06-09 MED ORDER — ASPIRIN 81 MG PO CHEW
324.0000 mg | CHEWABLE_TABLET | Freq: Once | ORAL | Status: AC
  Administered 2016-06-09: 324 mg via ORAL
  Filled 2016-06-09: qty 4

## 2016-06-09 MED ORDER — PANTOPRAZOLE SODIUM 40 MG IV SOLR
40.0000 mg | Freq: Every day | INTRAVENOUS | Status: DC
  Administered 2016-06-10: 40 mg via INTRAVENOUS
  Filled 2016-06-09: qty 40

## 2016-06-09 MED ORDER — HYDROCORTISONE NA SUCCINATE PF 100 MG IJ SOLR
50.0000 mg | Freq: Four times a day (QID) | INTRAMUSCULAR | Status: DC
  Administered 2016-06-09 – 2016-06-10 (×3): 50 mg via INTRAVENOUS
  Filled 2016-06-09 (×4): qty 2

## 2016-06-09 MED ORDER — ASPIRIN 81 MG PO CHEW: 81.0000 mg | CHEWABLE_TABLET | Freq: Every day | ORAL | Status: DC

## 2016-06-09 NOTE — Progress Notes (Signed)
ANTICOAGULATION CONSULT NOTE - Follow-up Consult  Pharmacy Consult for heparin Indication: chest pain/ACS  Allergies  Allergen Reactions  . Penicillins Rash  . Sulfur Swelling    Patient Measurements:   Heparin Dosing Weight: 113kg  Vital Signs: Temp: 98.2 F (36.8 C) (02/20 2000) Temp Source: Oral (02/20 2000) BP: 100/60 (02/21 0400) Pulse Rate: 83 (02/21 0400)  Labs:  Recent Labs  05/21/2016 0729 05/28/2016 1201 06/04/2016 1800 06/09/16 0248 06/09/16 0339  HGB 16.9  --   --   --  14.8  HCT 49.7  --   --   --  45.0  PLT 246  --   --   --  216  APTT 35  --   --   --   --   LABPROT 22.1*  --   --   --  25.4*  INR 1.91  --   --   --  2.27  HEPARINUNFRC  --   --  <0.10*  --  <0.10*  CREATININE 2.05*  --   --   --  2.72*  TROPONINI 0.06* 0.77* 10.34* 19.26*  --     Estimated Creatinine Clearance: 42.7 mL/min (by C-G formula based on SCr of 2.72 mg/dL (H)).  Assessment: 65 yom on heparin for STEMI. Heparin level remains undetectable on gtt at 1400 units/hr. INR remains elevated to 2.27 likely due to liver disease - slight upward trend. Hgb, plt trending down a bit but still wnl. No issues with line or bleeding reported per RN.  Goal of Therapy:  Heparin level 0.3-0.7 units/ml Monitor platelets by anticoagulation protocol: Yes   Plan:  Increase heparin gtt 2150 units/hr - no bolus at this time due to elevated INR Check a 6 hr heparin level  Sherlon Handing, PharmD, BCPS Clinical pharmacist, pager [phone] 06/09/2016,4:40 AM

## 2016-06-09 NOTE — Progress Notes (Signed)
Pharmacy Antibiotic Note  Stephen Marquez is a 61 y.o. male with septic shock and peritonitis. Pharmcy consulted to dose vancomycin and azactam. -SCr= 2.73 (up; noted 0.5 on 05/18/16), normalized CrCl ~ 30 -WBC= 14.4, afeb, LA= 4.9, PCT= 16 -peritoneal fluid cultures- GPC pairs, GPR   Plan: -Will check a random vancomycin level in am -Continue Azactam 1gmIV q8h -Will follow renal function, cultures and clinical progress   Temp (24hrs), Avg:97.9 F (36.6 C), Min:97.3 F (36.3 C), Max:98.4 F (36.9 C)   Recent Labs Lab 06/02/2016 0729 05/30/2016 0749 05/26/2016 1051 06/09/16 0339  WBC 9.9  --   --  14.4*  CREATININE 2.05*  --   --  2.72*  LATICACIDVEN  --  4.9* 4.4*  --     Estimated Creatinine Clearance: 42.5 mL/min (by C-G formula based on SCr of 2.72 mg/dL (H)).    Allergies  Allergen Reactions  . Penicillins Rash  . Sulfur Swelling    Antimicrobials this admission: 2/20 azac>> 2/20 vanc>> 2/20 cipro/flagyl x1  Dose adjustments this admission:   Microbiology results: 2/20 blood x2 2/19 peritoneal fluid- GPC pairs, GPR   Thank you for allowing pharmacy to be a part of this patient's care.  Hildred Laser, Pharm D 06/09/2016 9:38 AM

## 2016-06-09 NOTE — Progress Notes (Signed)
Dr. Titus Mould spoke with patient's wife and they have agreed to make patient a DNR. Pressors are okay at this time. Order placed.

## 2016-06-09 NOTE — Progress Notes (Signed)
[facility] PROGRESS NOTE Subjective patient of Dr. Dr Leanna Sato with cirrhosis possibly due to [facility] with CT scan building diffuse metastatic liver disease that is felt to be cholangiocarcinoma after evaluation had [facility] and was not felt to be candidate for liver transplant. He is developed progressive ascites. Had large volume paracentesis 2 days ago followed by a fall and apparently had an MI per enzymes but I do not see a note from cardiology. He was found to have 5000 WBCs almost all neutrophils in his paracentesis fluid and cultures are positive for gram-positive Cocci. Patient is on vancomycin. He and his son note that he has had very little urine output and feels that the paracentesis fluid is reaccumulation. He has been seen by oncology  Objective: Vital signs in last 24 hours: Temp:  [97.3 F (36.3 C)-98.4 F (36.9 C)] 97.3 F (36.3 C) (02/21 0810) Pulse Rate:  [65-95] 95 (02/21 0700) Resp:  [19-29] 21 (02/21 0700) BP: (68-130)/(25-80) 100/56 (02/21 0700) SpO2:  [84 %-99 %] 96 % (02/21 0800) Weight:  [130.2 kg (287 lb 0.6 oz)] 130.2 kg (287 lb 0.6 oz) (02/21 0455) Last BM Date: 06/05/16  Intake/Output from previous day: 02/20 0701 - 02/21 0700 In: 6084.2 [I.V.:2334.2; IV Piggyback:3750] Out: -  Intake/Output this shift: No intake/output data recorded.  PE: General-- patient appears to be slightly confused but is hungry and is eating all the food on his plate  Abdomen-- distended fair amount of ascites. Not really very tender  Lab Results:  Recent Labs  05/21/2016 0729 06/09/16 0339  WBC 9.9 14.4*  HGB 16.9 14.8  HCT 49.7 45.0  PLT 246 216   BMET  Recent Labs  05/26/2016 0729 06/09/16 0339  NA 130* 129*  K 4.7 4.7  CL 93* 95*  CO2 21* 20*  CREATININE 2.05* 2.72*   LFT  Recent Labs  06/07/2016 0729 06/09/16 0339  PROT 4.7* 5.0*  AST 97* 418*  ALT 29 125*  ALKPHOS 60 60  BILITOT 4.5* 4.3*  BILIDIR  --  2.7*  IBILI  --  1.6*    PT/INR  Recent Labs  05/31/2016 0729 06/09/16 0339  LABPROT 22.1* 25.4*  INR 1.91 2.27   PANCREAS No results for input(s): LIPASE in the last 72 hours.       Studies/Results: Ct Head Wo Contrast  Result Date: 05/31/2016 CLINICAL DATA:  Recent fall, patient is on a blood thinner EXAM: CT HEAD WITHOUT CONTRAST TECHNIQUE: Contiguous axial images were obtained from the base of the skull through the vertex without intravenous contrast. COMPARISON:  None. FINDINGS: Brain: The ventricular system is normal in size and configuration for age, and the septum is in a normal midline position. The fourth ventricle and basilar cisterns are unremarkable. No hemorrhage, mass lesion, or acute infarction is seen. Scattered dural calcifications are present. Vascular: No vascular abnormality seen on this unenhanced study. Skull: No calvarial fracture is seen. Sinuses/Orbits: The paranasal sinuses are well pneumatized. Other: None. IMPRESSION: Negative unenhanced CT of the brain. Electronically Signed   By: Ivar Drape M.D.   On: 06/07/2016 08:19   US Paracentesis  Result Date: 06/07/2016 INDICATION: Recurrent abdominal ascites secondary to hepatic cirrhosis. Request for diagnostic and therapeutic paracentesis up to 12 liters. EXAM: ULTRASOUND GUIDED RIGHT LATERAL ABDOMEN PARACENTESIS MEDICATIONS: 1% Lidocaine = 10 mL. COMPLICATIONS: None immediate. PROCEDURE: Informed written consent was obtained from the patient after a discussion of the risks, benefits and alternatives to treatment. A timeout was performed prior to the initiation  of the procedure. Initial ultrasound scanning demonstrates a large amount of ascites within the right lateral abdomen. The right lateral abdomen was prepped and draped in the usual sterile fashion. 1% lidocaine with epinephrine was used for local anesthesia. Following this, a 19 gauge, 7-cm, Yueh catheter was introduced. An ultrasound image was saved for documentation purposes. The  paracentesis was performed. The catheter was removed and a dressing was applied. The patient tolerated the procedure well without immediate post procedural complication. FINDINGS: A total of approximately 12 liters of clear dark gold fluid was removed. Samples were sent to the laboratory as requested by the clinical team. IMPRESSION: Successful ultrasound-guided paracentesis yielding 12 liters of peritoneal fluid. Read by:  Gareth Eagle, PA-C Electronically Signed   By: Markus Daft M.D.   On: 06/07/2016 11:24    Medications: I have reviewed the patient's current medications.  Assessment/Plan: 1. Cirrhosis with marked ascites. 2. Diffuse metastatic cancer in the liver. Records indicate this was diagnosed as cholangiocarcinoma. Was evaluated for liver transplantation at [facility] and was not felt to be a candidate. 3. Severe peritonitis. Culture show gram-positive cocci in his peritoneal fluid. He is on vancomycin 4. Possible MI by enzymes.  I would definitely continue treatment with IV antibiotics. Will need to monitor his urine output carefully and his creatinine. He currently is full code and we may need to address this as well. Will follow with you   Davanta Meuser JR,Kenton Fortin L 06/09/2016, 8:51 AM  This note was created using voice recognition software. Minor errors may Have occurred unintentionally.  Pager: [phone] If no answer or after hours call [phone]

## 2016-06-09 NOTE — Progress Notes (Signed)
ANTICOAGULATION CONSULT NOTE - Follow-up Consult  Pharmacy Consult for heparin Indication: chest pain/ACS  Allergies  Allergen Reactions  . Penicillins Rash  . Sulfur Swelling    Patient Measurements: Weight: 287 lb 0.6 oz (130.2 kg) Heparin Dosing Weight: 113kg  Vital Signs: Temp: 98.7 F (37.1 C) (02/21 1137) Temp Source: Oral (02/21 1137) BP: 105/57 (02/21 1100) Pulse Rate: 83 (02/21 1100)  Labs:  Recent Labs  05/25/2016 0729 05/25/2016 1201 06/12/2016 1800 06/09/16 0248 06/09/16 0339 06/09/16 1206  HGB 16.9  --   --   --  14.8  --   HCT 49.7  --   --   --  45.0  --   PLT 246  --   --   --  216  --   APTT 35  --   --   --   --   --   LABPROT 22.1*  --   --   --  25.4*  --   INR 1.91  --   --   --  2.27  --   HEPARINUNFRC  --   --  <0.10*  --  <0.10* 0.21*  CREATININE 2.05*  --   --   --  2.72*  --   TROPONINI 0.06* 0.77* 10.34* 19.26*  --   --     Estimated Creatinine Clearance: 42.5 mL/min (by C-G formula based on SCr of 2.72 mg/dL (H)).  Assessment: 89 yom on heparin for STEMI. Heparin level is 0.21 on 2150 units/hr. INR remains elevated to 2.27 likely due to liver disease   Goal of Therapy:  Heparin level= 0.3-0.5 (due to elevated INR) Monitor platelets by anticoagulation protocol: Yes   Plan:  -Increase heparin to 2350 units/hr -Heparin level in 8 hours and daily wth CBC daily  Hildred Laser, Pharm D 06/09/2016 1:04 PM

## 2016-06-09 NOTE — H&P (Addendum)
PULMONARY / CRITICAL CARE MEDICINE   Name: Stephen Marquez MRN: [id number] DOB: 11-12-1955    ADMISSION DATE:  06/02/2016 CONSULTATION DATE:  06/15/2016  REFERRING MD:  Dr. Dayna Barker EDP  CHIEF COMPLAINT:  Syncope  HISTORY OF PRESENT ILLNESS:  History gained largely from wife as patient is encephalopathic. 61 year old male with past medical history as below, which is significant for coronary artery disease status post CABG x4 in 1999, DM2, and atrial fibrillaiton status post ablation. In August 2017 he was found have elevated liver function testing, and since has progressed to cirrhosis. He is being followed at [facility] for this and consideration for transplant. Recent CT scan 2/8 at [facility] demonstrated multiple liver lesions and a gastroenterology is highly concerned for widely metastatic cholangiocarcinoma after discussion with multidisciplinary team 2/13.   Starting 2/17 he was experiencing vomiting and has been unable to keep food down. He then underwent paracentesis 2/19 and had 12L removed. Vomiting lead to progressive weakness and he suffered a syncopal episode 2/20 AM and he was brought by EMS to the ED. Initial EKG demonstrated STEMI, however troponin normal. He was evaluated by cardiology who feels as though he is probably having acute inferior MI, but EKG improved. Felt that risks of cath outweigh benefits given his acute and chronic illness.   He was also found to be hypotensive despite albumin administration and 3L IVF. He was started on Levophed for BP support.    SUBJECTIVE:  Neo 175, following commands, no CP, has abdo pain   VITAL SIGNS: BP (!) 105/57   Pulse 83   Temp 97.3 F (36.3 C) (Oral)   Resp (!) 28   Wt 130.2 kg (287 lb 0.6 oz)   SpO2 97%   BMI 35.88 kg/m   HEMODYNAMICS:    VENTILATOR SETTINGS:    INTAKE / OUTPUT: I/O last 3 completed shifts: In: 6084.2 [I.V.:2334.2; IV Piggyback:3750] Out: -   PHYSICAL EXAMINATION: General:  Pale, jaundiced male in  resp distress when flat Neuro:  Alert, oriented, fc, moves all ext equally HEENT:  [place]/AT, PERRL, no JVD Cardiovascular:  RRR, no MRG. Some runs VT Lungs:  Clear Abdomen:  Protuberant, fluctuant. Tender, fluid wave Musculoskeletal:  Soft, non-tender, non-distended Skin:  Jaundiced, intact  LABS:  BMET  Recent Labs Lab 05/23/2016 0729 06/09/16 0339  NA 130* 129*  K 4.7 4.7  CL 93* 95*  CO2 21* 20*  BUN 62* 61*  CREATININE 2.05* 2.72*  GLUCOSE 211* 168*    Electrolytes  Recent Labs Lab 06/13/2016 0729 06/09/16 0339  CALCIUM 12.0* 11.1*  MG  --  2.0  PHOS  --  4.8*    CBC  Recent Labs Lab 05/28/2016 0729 06/09/16 0339  WBC 9.9 14.4*  HGB 16.9 14.8  HCT 49.7 45.0  PLT 246 216    Coag's  Recent Labs Lab 06/09/2016 0729 06/09/16 0339  APTT 35  --   INR 1.91 2.27    Sepsis Markers  Recent Labs Lab 06/15/2016 0749 05/23/2016 1051  LATICACIDVEN 4.9* 4.4*  PROCALCITON  --  16.09    ABG No results for input(s): PHART, PCO2ART, PO2ART in the last 168 hours.  Liver Enzymes  Recent Labs Lab 06/03/2016 0729 06/09/16 0339  AST 97* 418*  ALT 29 125*  ALKPHOS 60 60  BILITOT 4.5* 4.3*  ALBUMIN 2.5* 3.3*    Cardiac Enzymes  Recent Labs Lab 05/22/2016 1201 05/27/2016 1800 06/09/16 0248  TROPONINI 0.77* 10.34* 19.26*    Glucose  Recent Labs Lab  05/22/2016 1307 05/28/2016 1652 05/31/2016 2011 06/11/2016 2353 06/09/16 0532 06/09/16 0812  GLUCAP 180* 184* 172* 221* 210* 140*    Imaging No results found.   STUDIES:  CT head 2/20 >neg  CULTURES: Blood 2/20 > Peritoneal 2/19 > GCP chains/pairs  ANTIBIOTICS: Vancomycin 2/20 > Zosyn 2/20 > Cipro 2/19 > 2/20  SIGNIFICANT EVENTS: 2/19 para 12 liters 2/20 MI, shock  LINES/TUBES: Femoral CVL 2/20 >  DISCUSSION:   ASSESSMENT / PLAN:  PULMONARY A: No acute issues  P:   Supplemental O2 PRN Incentive spirometry Obtain pcxr for volume status  CARDIOVASCULAR A:  Shock: multifactorial due  to sepsis, hypovolemia (12 liters off para), MI Acute MI CAD s/p CABG 1990s   P:  Telemetry Cards following Neo for BP support as was having runs VT while on Levo, unclear if related but switched to neo Amiodarone, would like to dc this with liver dz if able Albumin maintain as can improve outcomes in SBP Ensure lactic clearing, may not clear with liver dz Consider MAP 55, sys 85, with liver dz Holding home Imdur, metoprolol, losartan Cortisol then empiric stress  roids May need cvp, need pall discussions   RENAL A:   AKI: azotemia vs hepatorenal Hyponatremia  P:   Allow pos balance pcxr for volume Urine na esnure done Place folwy , no output Bl;adder scan  GASTROINTESTINAL A:   NASH cirrhosis GI at [place] suspects widely metastatic cholangiocarcinoma.  Ascites > s/p paracentesis 2/19 with 12L drained  P:   lft in am  IV Protonix for SUP NPO except ice chips Lactulose as tolerated but watch total amount output as BP relates  HEMATOLOGIC A:   Cholangiocarcinoma suspected  P:  Oncology consultation Hep drip per cards  INFECTIOUS A:   Septic shock secondary to secondary peritonitis GPC in peritoneal fluid  P:   ABX course - azactam, vanc Follow ID  ENDOCRINE A:   DM R/o rel AI P:   CBG monitoring and SSI cortisol  NEUROLOGIC A:   Acute encephalopathy, sepsis vs hepatic P:   RASS goal: 0 Monitor Lactulose as able  FAMILY  - Updates: Patient and wife updated in ED  - Inter-disciplinary family meet or Palliative Care meeting due by:  2/20  Ccm 35 min   Lavon Paganini. Titus Mould, MD, FACP Pgr: [facility]   I have had extensive discussions with family wife. We discussed patients current circumstances and organ failures. We also discussed patient's prior wishes under circumstances such as this. Family has decided to NOT perform resuscitation if arrest but to continue current medical support for now.

## 2016-06-09 NOTE — Progress Notes (Signed)
eLink Physician-Brief Progress Note Patient Name: Stephen Marquez DOB: 12-Aug-1955 MRN: [id number]   Date of Service  06/09/2016  HPI/Events of Note  Encephalopathy. Unable to take PO  meds presently  eICU Interventions  PPI changed to IV     Intervention Category Intermediate Interventions: Other:;Medication change / dose adjustment  Wilhelmina Mcardle 06/09/2016, 11:44 PM

## 2016-06-10 DIAGNOSIS — K7031 Alcoholic cirrhosis of liver with ascites: Secondary | ICD-10-CM

## 2016-06-10 LAB — AFP TUMOR MARKER

## 2016-06-10 LAB — COMPREHENSIVE METABOLIC PANEL
ALK PHOS: 72 U/L (ref 38–126)
ALT: 104 U/L — AB (ref 17–63)
ANION GAP: 14 (ref 5–15)
AST: 227 U/L — ABNORMAL HIGH (ref 15–41)
Albumin: 3.4 g/dL — ABNORMAL LOW (ref 3.5–5.0)
BUN: 73 mg/dL — ABNORMAL HIGH (ref 6–20)
CALCIUM: 10.3 mg/dL (ref 8.9–10.3)
CO2: 20 mmol/L — ABNORMAL LOW (ref 22–32)
CREATININE: 3.82 mg/dL — AB (ref 0.61–1.24)
Chloride: 95 mmol/L — ABNORMAL LOW (ref 101–111)
GFR, EST AFRICAN AMERICAN: 18 mL/min — AB (ref >60)
GFR, EST NON AFRICAN AMERICAN: 16 mL/min — AB (ref >60)
Glucose, Bld: 131 mg/dL — ABNORMAL HIGH (ref 65–99)
Potassium: 5.3 mmol/L — ABNORMAL HIGH (ref 3.5–5.1)
Sodium: 129 mmol/L — ABNORMAL LOW (ref 135–145)
Total Bilirubin: 4 mg/dL — ABNORMAL HIGH (ref 0.3–1.2)
Total Protein: 5.1 g/dL — ABNORMAL LOW (ref 6.5–8.1)

## 2016-06-10 LAB — CBC WITH DIFFERENTIAL/PLATELET
BASOS ABS: 0.2 10*3/uL — AB (ref 0.0–0.1)
BASOS PCT: 1 %
EOS ABS: 0 10*3/uL (ref 0.0–0.7)
Eosinophils Relative: 0 %
HCT: 44.5 % (ref 39.0–52.0)
HEMOGLOBIN: 14.1 g/dL (ref 13.0–17.0)
LYMPHS ABS: 0.9 10*3/uL (ref 0.7–4.0)
LYMPHS PCT: 4 %
MCH: 29.6 pg (ref 26.0–34.0)
MCHC: 31.7 g/dL (ref 30.0–36.0)
MCV: 93.3 fL (ref 78.0–100.0)
Monocytes Absolute: 3.3 10*3/uL — ABNORMAL HIGH (ref 0.1–1.0)
Monocytes Relative: 14 %
NEUTROS ABS: 18.9 10*3/uL — AB (ref 1.7–7.7)
Neutrophils Relative %: 81 %
Platelets: 226 10*3/uL (ref 150–400)
RBC: 4.77 MIL/uL (ref 4.22–5.81)
RDW: 16.2 % — ABNORMAL HIGH (ref 11.5–15.5)
WBC: 23.3 10*3/uL — ABNORMAL HIGH (ref 4.0–10.5)

## 2016-06-10 LAB — GLUCOSE, CAPILLARY: GLUCOSE-CAPILLARY: 142 mg/dL — AB (ref 65–99)

## 2016-06-10 LAB — VANCOMYCIN, RANDOM: Vancomycin Rm: 17

## 2016-06-10 LAB — HEPARIN LEVEL (UNFRACTIONATED): Heparin Unfractionated: 0.21 IU/mL — ABNORMAL LOW (ref 0.30–0.70)

## 2016-06-10 LAB — CULTURE, BODY FLUID W GRAM STAIN -BOTTLE

## 2016-06-10 LAB — PTH, INTACT AND CALCIUM
CALCIUM TOTAL (PTH): 11 mg/dL — AB (ref 8.7–10.2)
PTH: 12 pg/mL — AB (ref 15–65)

## 2016-06-10 LAB — CULTURE, BODY FLUID-BOTTLE

## 2016-06-10 LAB — CEA: CEA: 254.2 ng/mL — ABNORMAL HIGH (ref 0.0–4.7)

## 2016-06-10 LAB — PROTIME-INR
INR: 2.1
PROTHROMBIN TIME: 23.9 s — AB (ref 11.4–15.2)

## 2016-06-10 MED ORDER — MORPHINE BOLUS VIA INFUSION
5.0000 mg | INTRAVENOUS | Status: DC | PRN
  Administered 2016-06-10: 5 mg via INTRAVENOUS
  Filled 2016-06-10: qty 20

## 2016-06-10 MED ORDER — SODIUM CHLORIDE 0.9 % IV SOLN
2.0000 mg/h | INTRAVENOUS | Status: DC
  Administered 2016-06-10: 7 mg/h via INTRAVENOUS
  Filled 2016-06-10 (×2): qty 10

## 2016-06-10 MED ORDER — MORPHINE BOLUS VIA INFUSION
2.0000 mg | INTRAVENOUS | Status: DC | PRN
  Filled 2016-06-10: qty 2

## 2016-06-10 MED ORDER — FENTANYL CITRATE (PF) 100 MCG/2ML IJ SOLN
25.0000 ug | INTRAMUSCULAR | Status: DC | PRN
  Administered 2016-06-10: 100 ug via INTRAVENOUS
  Filled 2016-06-10: qty 2

## 2016-06-10 MED ORDER — SODIUM CHLORIDE 0.9 % IV SOLN: 10.0000 mg/h | INTRAVENOUS | Status: DC

## 2016-06-11 ENCOUNTER — Telehealth: Payer: Self-pay

## 2016-06-11 NOTE — Telephone Encounter (Signed)
On 06/11/16 I received a death certificate from [place] ([place] The death certificate is for burial. The patient is a patient of Doctor Titus Mould. The death certificate will be taken to [facility] this pm for signature.  On 06-23-16 I received the death certificate from Doctor Nelda Marseille who signed the death certificate for Doctor Titus Mould since he is on vacation this week. I got the death certificate ready and called the funeral home to let them know the death certificate is ready for pickup.

## 2016-06-12 LAB — CANCER ANTIGEN 19-9: CA 19 9: 42829 U/mL — AB (ref 0–35)

## 2016-06-13 LAB — CULTURE, BLOOD (ROUTINE X 2)
Culture: NO GROWTH
Culture: NO GROWTH

## 2016-06-13 LAB — PTH-RELATED PEPTIDE: PTH-related peptide: <1.1 pmol/L

## 2016-06-17 NOTE — Progress Notes (Signed)
Patient experienced bradycardia to 20s. Called patients wife to let her know of current situation. Recommended that she come back to the hospital. Patient will discuss with sons. Provided emotional support. Will continue to support patient and family.

## 2016-06-17 NOTE — Progress Notes (Signed)
Called MD regarding patient decline in LOC, increased agitation, decreasing O2 saturation. MD to call wife to determine wishes about care. Will continue to monitor.

## 2016-06-17 NOTE — Progress Notes (Signed)
Two RNs listened for 2 minutes no heart or lung sounds heard @ [phone], family @ bedside, emotional support given. Dr. Titus Mould aware, CDS called.

## 2016-06-17 NOTE — Progress Notes (Signed)
PULMONARY / CRITICAL CARE MEDICINE   Name: Stephen Marquez MRN: [id number] DOB: 06-05-1955    ADMISSION DATE:  05/20/2016 CONSULTATION DATE:  05/27/2016  REFERRING MD:  Dr. Dayna Barker EDP  CHIEF COMPLAINT:  Syncope  HISTORY OF PRESENT ILLNESS:  History gained largely from wife as patient is encephalopathic. 61 year old male with past medical history as below, which is significant for coronary artery disease status post CABG x4 in 1999, DM2, and atrial fibrillaiton status post ablation. In August 2017 he was found have elevated liver function testing, and since has progressed to cirrhosis. He is being followed at [facility] for this and consideration for transplant. Recent CT scan 2/8 at [facility] demonstrated multiple liver lesions and a gastroenterology is highly concerned for widely metastatic cholangiocarcinoma after discussion with multidisciplinary team 2/13.   Starting 2/17 he was experiencing vomiting and has been unable to keep food down. He then underwent paracentesis 2/19 and had 12L removed. Vomiting lead to progressive weakness and he suffered a syncopal episode 2/20 AM and he was brought by EMS to the ED. Initial EKG demonstrated STEMI, however troponin normal. He was evaluated by cardiology who feels as though he is probably having acute inferior MI, but EKG improved. Felt that risks of cath outweigh benefits given his acute and chronic illness.   He was also found to be hypotensive despite albumin administration and 3L IVF. He was started on Levophed for BP support.    SUBJECTIVE:  Shock, worsening resp failure, shock, ARF, MODS, DNR was established now he dying   VITAL SIGNS: BP (!) 83/39   Pulse 80   Temp 97.9 F (36.6 C) (Axillary)   Resp (!) 25   Wt 132 kg (291 lb 0.1 oz)   SpO2 92%   BMI 36.37 kg/m   HEMODYNAMICS:    VENTILATOR SETTINGS: FiO2 (%):  [55 %] 55 %  INTAKE / OUTPUT: I/O last 3 completed shifts: In: 4418.8 [P.O.:120; I.V.:3148.8; IV Piggyback:1150] Out:  -   PHYSICAL EXAMINATION: General:  Pale, jaundiced male in resp distress, some agonal Neuro:  Not following commands, agonal HEENT:  JVD Cardiovascular:  s1 s2 RRR 79, distant Lungs:  Cronchi crackles Abdomen:  Protuberant, fluctuant. Tender, fluid wave increased  Musculoskeletal: edema worsening Skin:  Jaundiced, intact  LABS:  BMET  Recent Labs Lab 06/07/2016 0729 06/09/16 0339 06/22/2016 0430  NA 130* 129* 129*  K 4.7 4.7 5.3*  CL 93* 95* 95*  CO2 21* 20* 20*  BUN 62* 61* 73*  CREATININE 2.05* 2.72* 3.82*  GLUCOSE 211* 168* 131*    Electrolytes  Recent Labs Lab 06/11/2016 0729 06/09/16 0339 06/22/16 0430  CALCIUM 12.0* 11.1* 10.3  MG  --  2.0  --   PHOS  --  4.8*  --     CBC  Recent Labs Lab 06/02/2016 0729 06/09/16 0339 Jun 22, 2016 0430  WBC 9.9 14.4* 23.3*  HGB 16.9 14.8 14.1  HCT 49.7 45.0 44.5  PLT 246 216 226    Coag's  Recent Labs Lab 05/24/2016 0729 06/09/16 0339 06/22/16 0430  APTT 35  --   --   INR 1.91 2.27 2.10    Sepsis Markers  Recent Labs Lab 05/26/2016 0749 05/30/2016 1051  LATICACIDVEN 4.9* 4.4*  PROCALCITON  --  16.09    ABG No results for input(s): PHART, PCO2ART, PO2ART in the last 168 hours.  Liver Enzymes  Recent Labs Lab 05/20/2016 0729 06/09/16 0339 06/22/2016 0430  AST 97* 418* 227*  ALT 29 125* 104*  ALKPHOS  60 60 72  BILITOT 4.5* 4.3* 4.0*  ALBUMIN 2.5* 3.3* 3.4*    Cardiac Enzymes  Recent Labs Lab 06/13/2016 1201 06/03/2016 1800 06/09/16 0248  TROPONINI 0.77* 10.34* 19.26*    Glucose  Recent Labs Lab 06/09/16 0532 06/09/16 0812 06/09/16 1139 06/09/16 1629 06/09/16 2035 07-05-2016 0008  GLUCAP 210* 140* 118* 122* 133* 142*    Imaging Dg Chest Port 1 View  Result Date: 06/09/2016 CLINICAL DATA:  Cirrhosis of the liver EXAM: PORTABLE CHEST 1 VIEW COMPARISON:  03/20/2005 FINDINGS: Cardiac enlargement with CABG changes. Pulmonary vascular congestion. Bilateral airspace disease. Small bilateral  effusions with bibasilar atelectasis. IMPRESSION: Fluid overload with vascular congestion and bilateral effusions. Bibasilar atelectasis. Electronically Signed   By: Franchot Gallo M.D.   On: 06/09/2016 12:48     STUDIES:  CT head 2/20 >neg  CULTURES: Blood 2/20 > Peritoneal 2/19 > GCP chains/pairs  ANTIBIOTICS: Vancomycin 2/20 > Zosyn 2/20 > Cipro 2/19 > 2/20  SIGNIFICANT EVENTS: 2/19 para 12 liters 2/20 MI, shock  LINES/TUBES: Femoral CVL 2/20 >  DISCUSSION:   ASSESSMENT / PLAN:  PULMONARY A: Acute resp failure pulm edema Shock: multifactorial due to sepsis, hypovolemia (12 liters off para), MI Acute MI CAD s/p CABG 1990s AKI: azotemia vs hepatorenal Hyponatremia NASH cirrhosis GI at [place] suspects widely metastatic cholangiocarcinoma.  Ascites > s/p paracentesis 2/19 with 12L drained Cholangiocarcinoma suspected Septic shock secondary to secondary peritonitis GPC in peritoneal fluid DM R/o rel AI Acute encephalopathy, sepsis vs hepatic  -he is in MODS, worsening status, DNR, this is not survivable -Extensive discussion with family wife. We discussed the poor prognosis and likely poor quality of life. Family has decided to offer full comfort care. They are aware that the patient may be transferred to palliative care floor for continued comfort care needs. They have been fully updated on the process and expectations. I again discussed that we dont have path on concerns cancer, VGI to also discuss, we stressed MODS, STEMI and terminal liver dz Dc O2 Dc pressors Titrate morphine to rr 12-18 Sons also at bedside and updated   FAMILY  - Updates: Patient and wife updated by me in room, full comfort plans started  - Inter-disciplinary family meet or Palliative Care meeting due by: done DF  Ccm 30 min   Lavon Paganini. Titus Mould, MD, [place] Pgr: [facility]

## 2016-06-17 NOTE — Progress Notes (Signed)
ANTICOAGULATION CONSULT NOTE - Follow-up Consult  Pharmacy Consult for heparin Indication: chest pain/ACS  Allergies  Allergen Reactions  . Penicillins Rash  . Sulfur Swelling    Patient Measurements: Weight: 287 lb 0.6 oz (130.2 kg) Heparin Dosing Weight: 113kg  Vital Signs: Temp: 97.9 F (36.6 C) (02/22 0012) Temp Source: Oral (02/22 0012) BP: 78/31 (02/22 0215) Pulse Rate: 89 (02/22 0215)  Labs:  Recent Labs  06/05/2016 0729 05/27/2016 1201  06/12/2016 1800 06/09/16 0248 06/09/16 0339 06/09/16 1206 2016-07-04 0116  HGB 16.9  --   --   --   --  14.8  --   --   HCT 49.7  --   --   --   --  45.0  --   --   PLT 246  --   --   --   --  216  --   --   APTT 35  --   --   --   --   --   --   --   LABPROT 22.1*  --   --   --   --  25.4*  --   --   INR 1.91  --   --   --   --  2.27  --   --   HEPARINUNFRC  --   --   < > <0.10*  --  <0.10* 0.21* 0.21*  CREATININE 2.05*  --   --   --   --  2.72*  --   --   TROPONINI 0.06* 0.77*  --  10.34* 19.26*  --   --   --   < > = values in this interval not displayed.  Estimated Creatinine Clearance: 42.5 mL/min (by C-G formula based on SCr of 2.72 mg/dL (H)).  Assessment: 66 yom on heparin for STEMI. Heparin level remains subtherapeutic (0.21) on 2350 units/hr. No change in heparin level with increased rate.  INR remains elevated to 2.27 likely due to liver disease. No issues with line or bleeding reported per RN.  Goal of Therapy:  Heparin level 0.3-0.5 (due to elevated INR) Monitor platelets by anticoagulation protocol: Yes   Plan:  -Increase heparin to 2600 units/hr -Heparin level in 8 hours  Sherlon Handing, PharmD, BCPS Clinical pharmacist, pager [phone]  07-04-2016 2:56 AM

## 2016-06-17 NOTE — Progress Notes (Signed)
Pt progressively more encephalopathic, no urine output. Wife and sons at the bedside. Long discussion with them about progressive liver failure and the resulting system effects, peritonitis, hepatorenal encephalopathy etc Pt known to have multiple liver mets and marked elevation of CEA. 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 Pancreas grossly ok and colonoscopies up to date. Discussed all of this with family. Agree with DNR and supportive measures family accepts this

## 2016-06-17 NOTE — Progress Notes (Signed)
240 mL of 250 mL bag of morphine wasted in sink by two RNs, Lexi and Melissa.

## 2016-06-17 NOTE — Progress Notes (Signed)
[facility] Progress Note Patient Name: Stephen Marquez DOB: 11/24/1955 MRN: [id number]   Date of Service  June 19, 2016  HPI/Events of Note  Called by RN re: decline in cognition and increasing agitation. Pt has appearance of being uncomfortable and in pain. I called his wife and informed her of this change in his status. We agree that the highest priority is that we provide relief from discomfort and suffering. Will order PRN fentanyl. He might need a continuous infusion. I suggested that she come in tonight and she intends to do so with her son who has just arrived into town  eICU Interventions  Fentanyl 25-100 mcg q 2 hrs PRN. Will monitor     Intervention Category Major Interventions: End of life / care limitation discussion  Wilhelmina Mcardle 2016/06/19, 2:08 AM

## 2016-06-17 NOTE — Progress Notes (Signed)
Responded to spiritual care consult. Patient deceased.

## 2016-06-17 DEATH — deceased

## 2016-07-18 NOTE — Discharge Summary (Signed)
Stephen Marquez, HUEGEL NO.:  [id number]  MEDICAL RECORD NO.:  [id number]  LOCATION:                                FACILITY:  [facility]  PHYSICIAN:  Raylene Miyamoto, MD DATE OF BIRTH:  07-31-1955  DATE OF ADMISSION:  06/02/2016 DATE OF DISCHARGE:  06-20-16                              DISCHARGE SUMMARY   DEATH SUMMARY  This is a 60-year-male with a past medical history significant for coronary artery disease, status post CABG; diabetes; atrial fibrillation.  In August 2017, found to have elevated liver function testing, which progressed to cirrhosis, being followed up with [facility] for consideration of transplant.  Recent CT scan on February 8th, demonstrated multiple liver lesions and a Gastroenterology highly suspicious for widely metastatic cholangiocarcinoma.  On February 17th, was experiencing vomiting and has been unable to keep food down. Underwent paracentesis for 12 L.  The patient has vomiting leading to progressive weakness and suffered syncope episode on February 20, brought by EMS to the Emergency Room at [facility]. EKG with a STEMI.  However, troponin was normal.  He was evaluated by Cardiology, who felt that he probably having acute inferior MI, but EKG improved and he was found not to be a candidate for cardiac catheterization given his terminal liver disease and likely cancer.  The patient worsened with shock, worsening respiratory failure, acute renal failure, multiorgan dysfunction syndrome was made the DNR by wife and son from [place], the patient expired.  FINAL DIAGNOSES UPON DEATH: 1. Acute myocardial infarction. 2. Shock, multifactorial secondary to hypovolemia, likely after having     12 L of fluid taken off and potentially sepsis. 3. Pulmonary edema. 4. Acute respiratory failure. 5. History of coronary artery disease, status post coronary artery     bypass graft. 6. Acute renal insufficiency. 7. Hyponatremia. 8.  Nonalcoholic steatohepatitis. 9. 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 10.Ascites secondary to liver failure. 11.SBP with CBCs and peritoneal fluid, treated with antibiotics     aggressively.     Raylene Miyamoto, MD   ______________________________ Raylene Miyamoto, MD    DJF/MEDQ  D:  06/22/2016  T:  06/22/2016  Job:  [id number]

## 2017-08-24 IMAGING — CT CT HEAD W/O CM
4 series · 15 of 47 positions shown, 17 images · non-contrast
Comparison: None.

CLINICAL DATA: Recent fall, patient is on a blood thinner

EXAM:
CT HEAD WITHOUT CONTRAST
TECHNIQUE: Contiguous axial images were obtained from the base of the skull
through the vertex without intravenous contrast.

[Series 2: head without · axial · non-contrast · 0.47mm/px · z∈[-93,+32]mm · 7 of 35 slices shown, 9 images]
[im 5/35  brain]
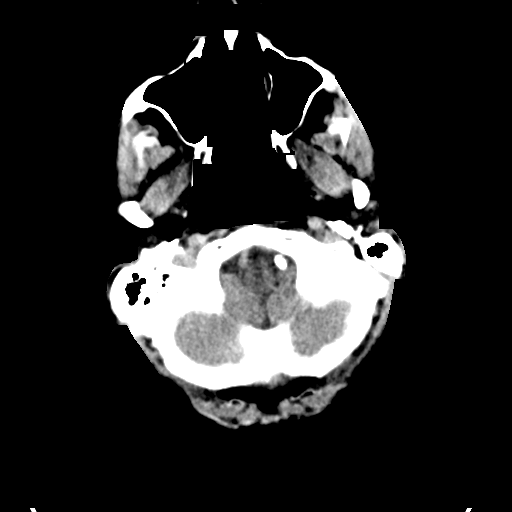
[im 5/35  bone]
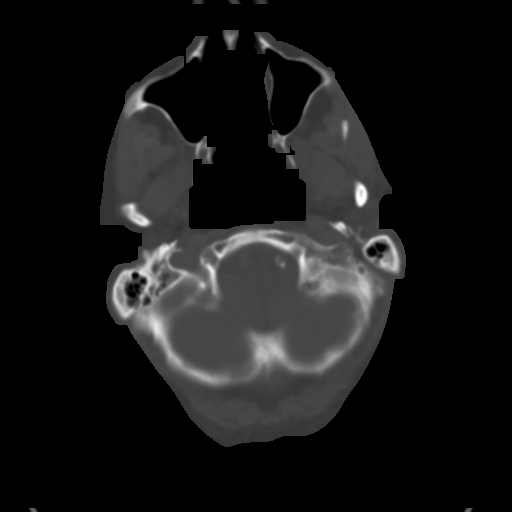
[im 9/35  brain]
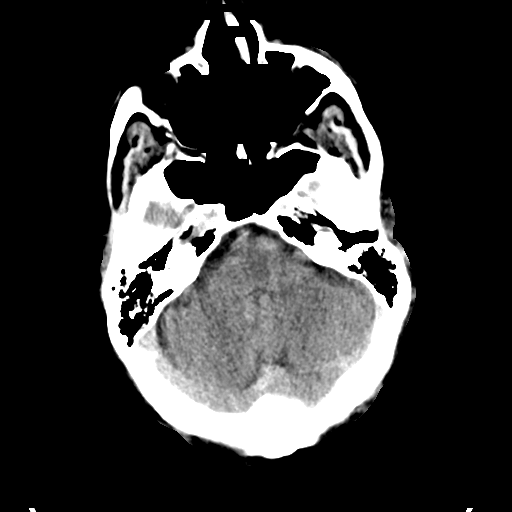
[im 13/35  brain]
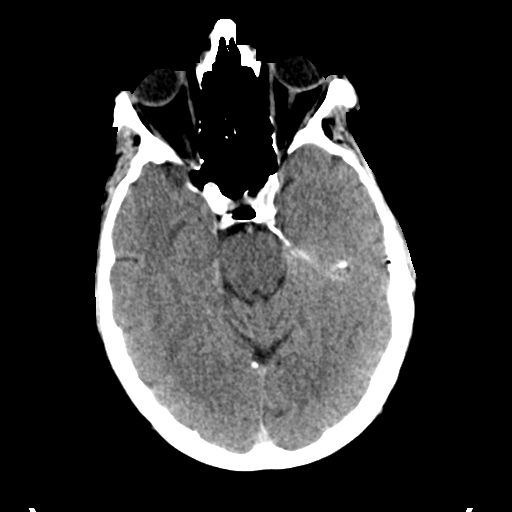
[im 18/35  brain]
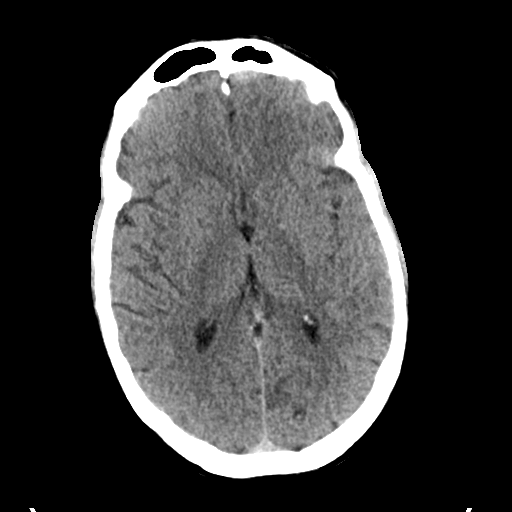
[im 22/35  brain]
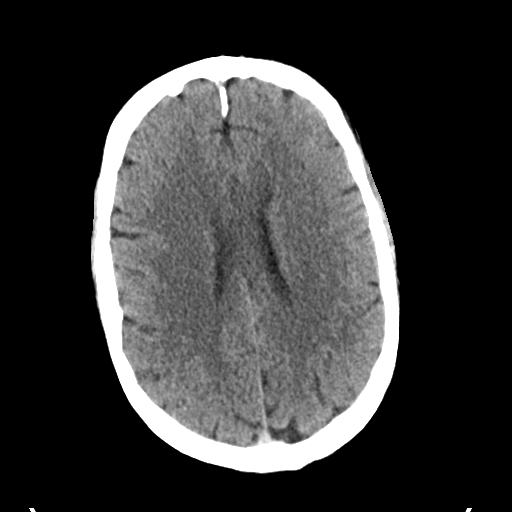
[im 22/35  bone]
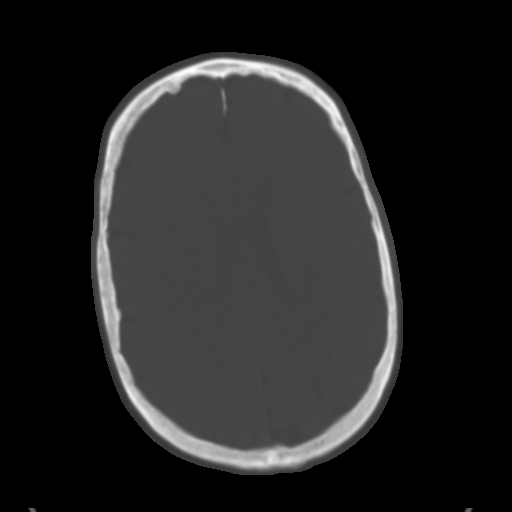
[im 26/35  brain]
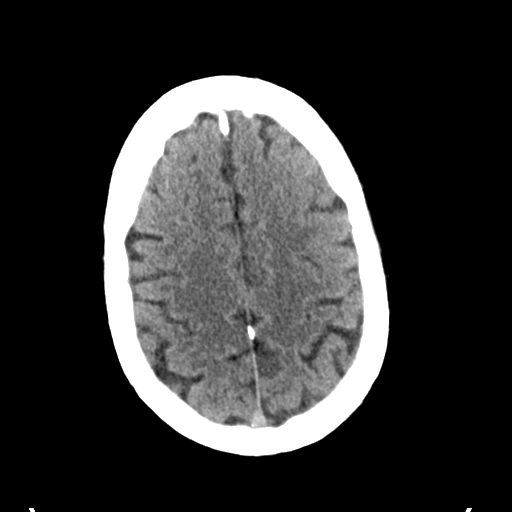
[im 30/35  brain]
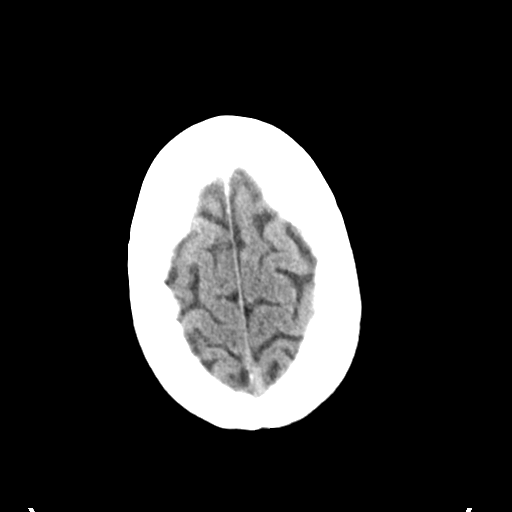

[Series 3: head bone · axial · 0.47mm/px · z∈[-97,-79]mm · 2 of 87 slices shown]
[im 9/87  bone]
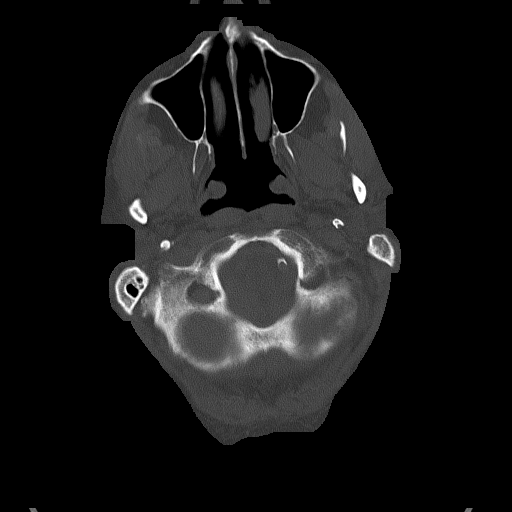
[im 18/87  bone]
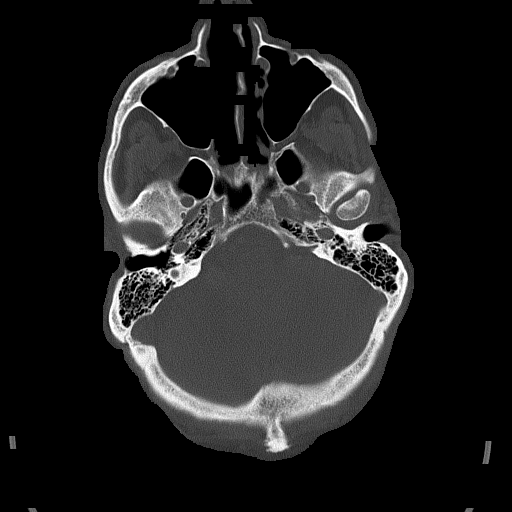

[Series 4: head without cor · coronal · non-contrast · 0.34mm/px · 3 of 74 slices shown]
[im 25/74  brain]
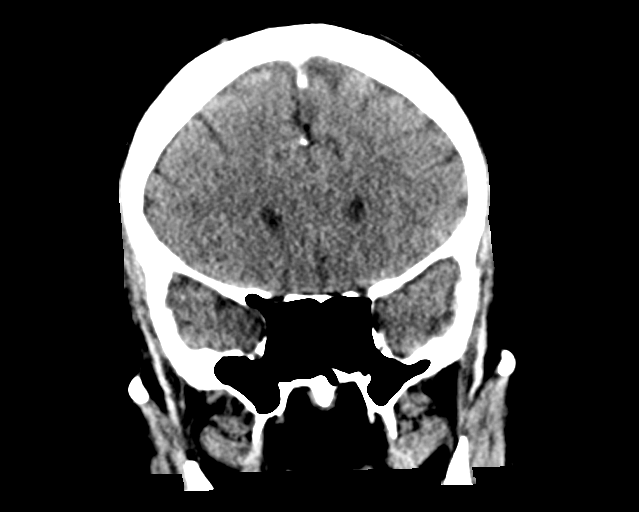
[im 33/74  brain]
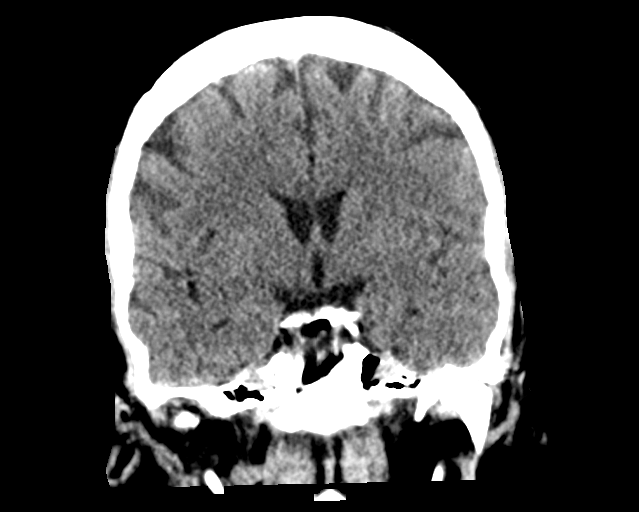
[im 41/74  brain]
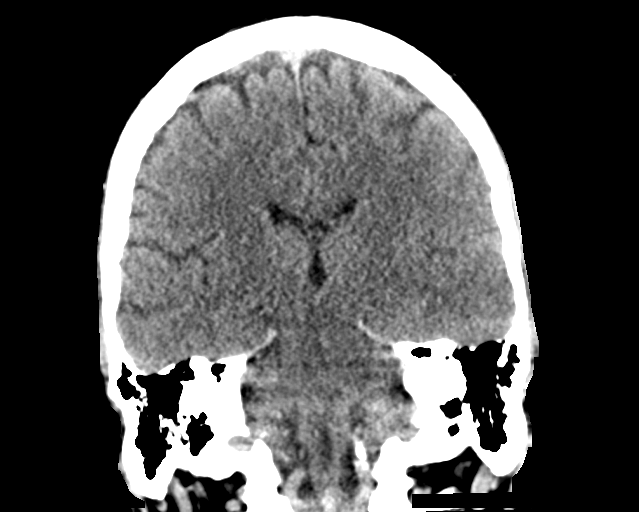

[Series 5: head without sag · sagittal · non-contrast · 0.37mm/px · 3 of 65 slices shown]
[im 22/65  brain]
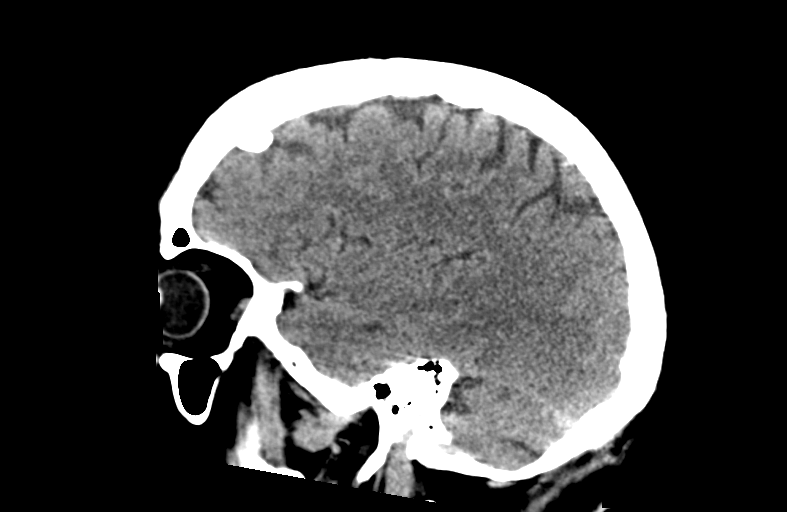
[im 33/65  brain]
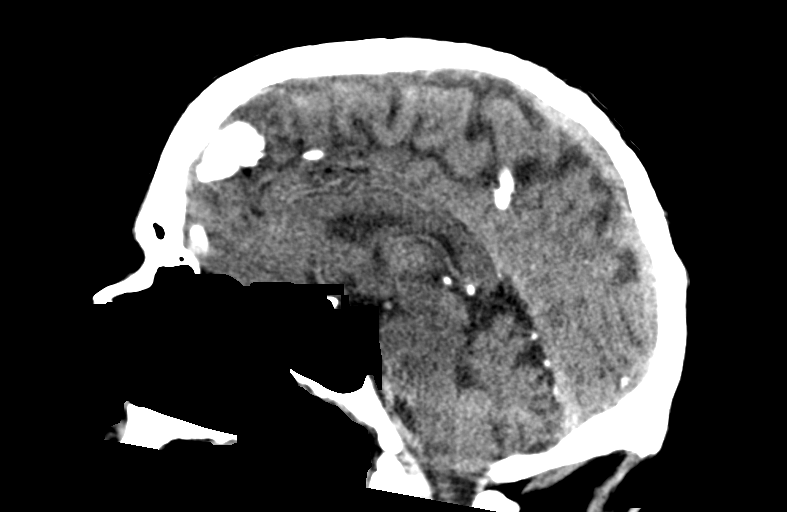
[im 43/65  brain]
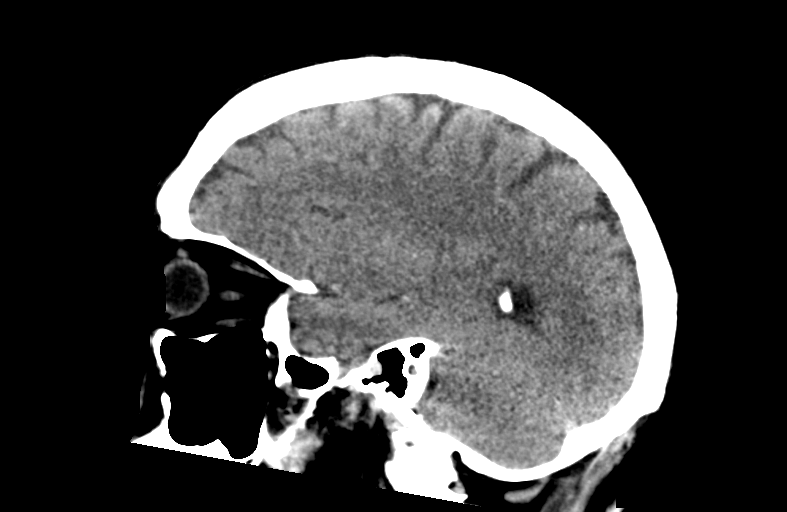

[15 of 47 positions shown; findings below may reference images not displayed]

FINDINGS: Brain: The ventricular system is normal in size and configuration
for age, and the septum is in a normal midline position. The fourth
ventricle and basilar cisterns are unremarkable. No hemorrhage, mass
lesion, or acute infarction is seen. Scattered dural calcifications
are present.

Vascular: No vascular abnormality seen on this unenhanced study.

Skull: No calvarial fracture is seen.

Sinuses/Orbits: The paranasal sinuses are well pneumatized.

Other: None.
IMPRESSION: Negative unenhanced CT of the brain.

## 2017-08-25 IMAGING — CR DG CHEST 1V PORT
1 series · 1 of 1 positions shown · non-contrast
Comparison: 03/20/2005

CLINICAL DATA: Cirrhosis of the liver

EXAM:
PORTABLE CHEST 1 VIEW

[AP]
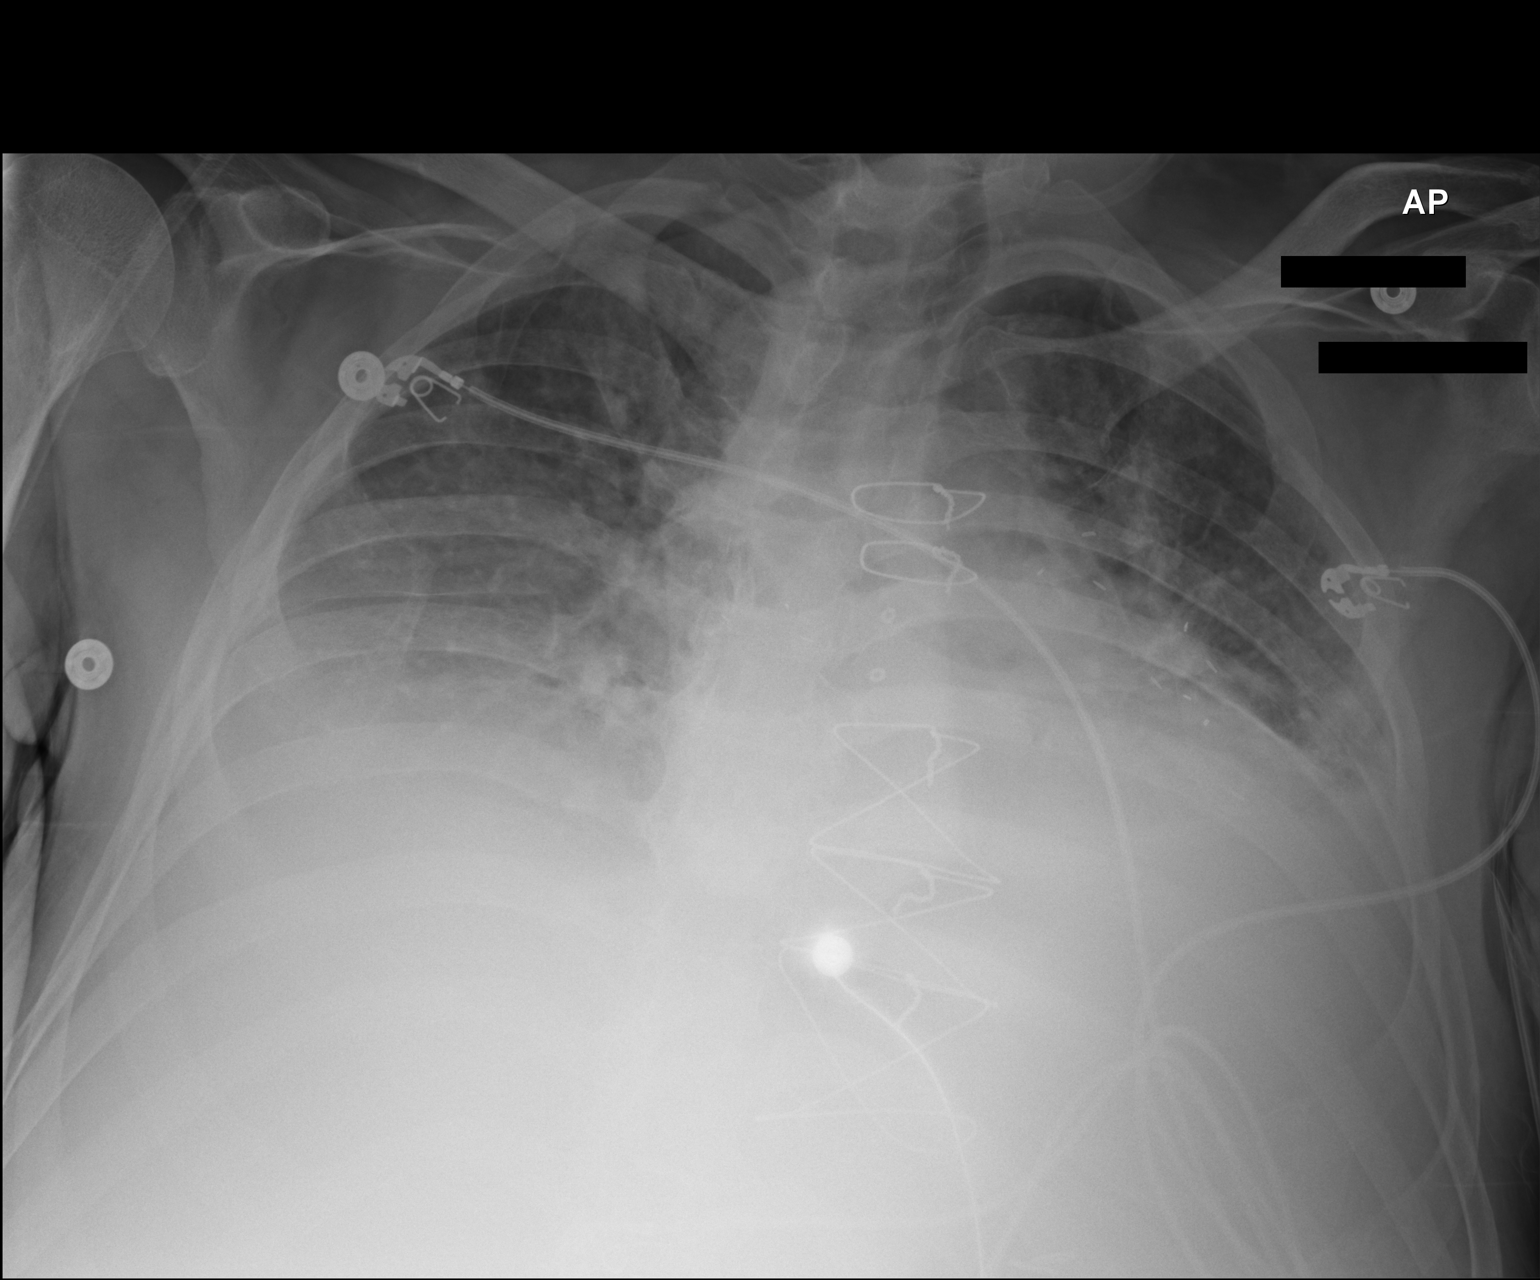

[1 of 1 positions shown; findings below may reference images not displayed]

FINDINGS: Cardiac enlargement with CABG changes. Pulmonary vascular
congestion. Bilateral airspace disease. Small bilateral effusions
with bibasilar atelectasis.
IMPRESSION: Fluid overload with vascular congestion and bilateral effusions.
Bibasilar atelectasis.

## 2019-02-12 IMAGING — US US PARACENTESIS
1 series · 4 of 4 positions shown · non-contrast
Comparison: none

INDICATION: Recurrent abdominal ascites secondary to hepatic cirrhosis. Request
for diagnostic and therapeutic paracentesis up to 12 liters.

[Series 1: us paracentesis · 0.26mm/px · 4 of 4 slices shown]
[im 1/4]
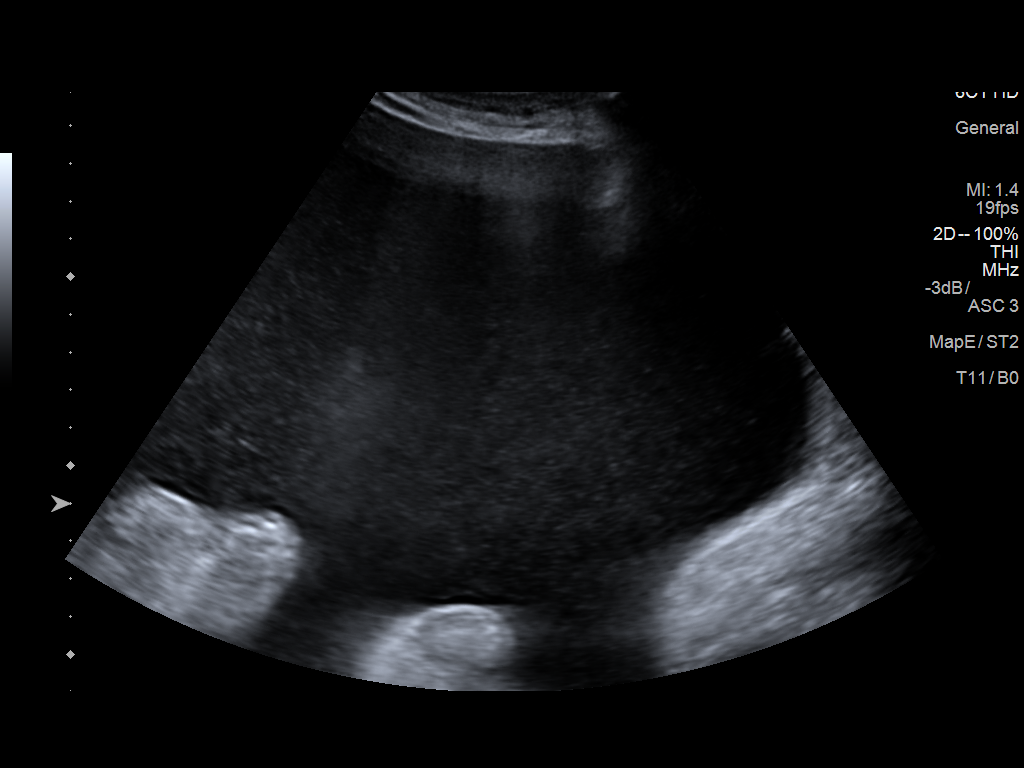
[im 2/4]
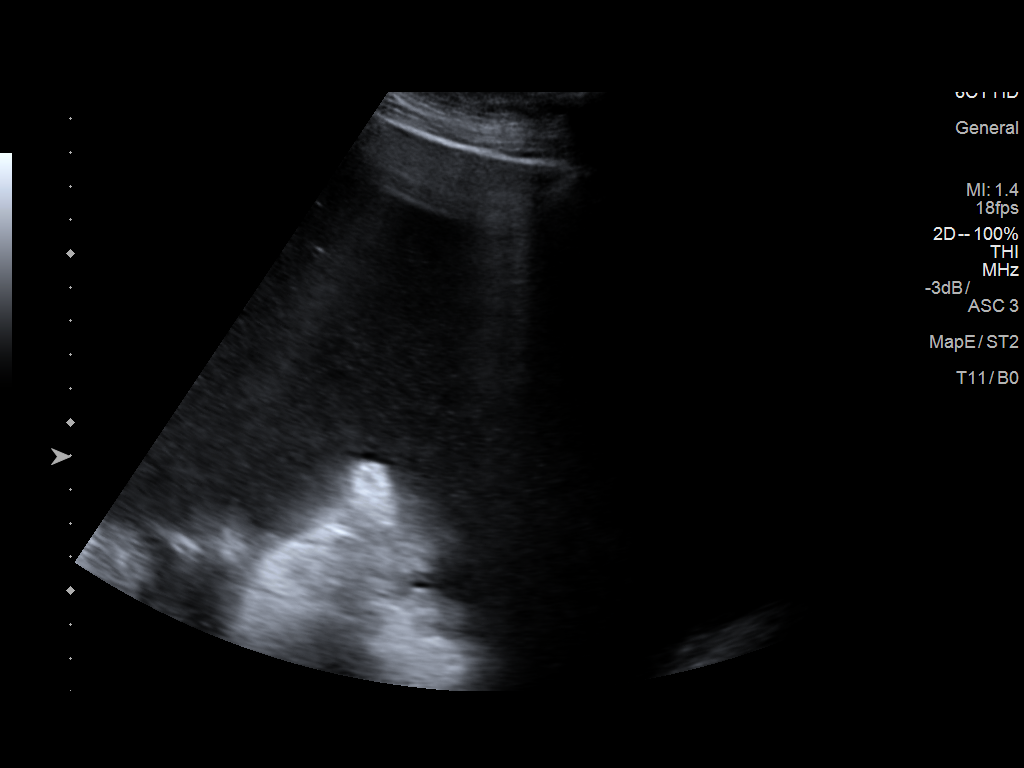
[im 3/4]
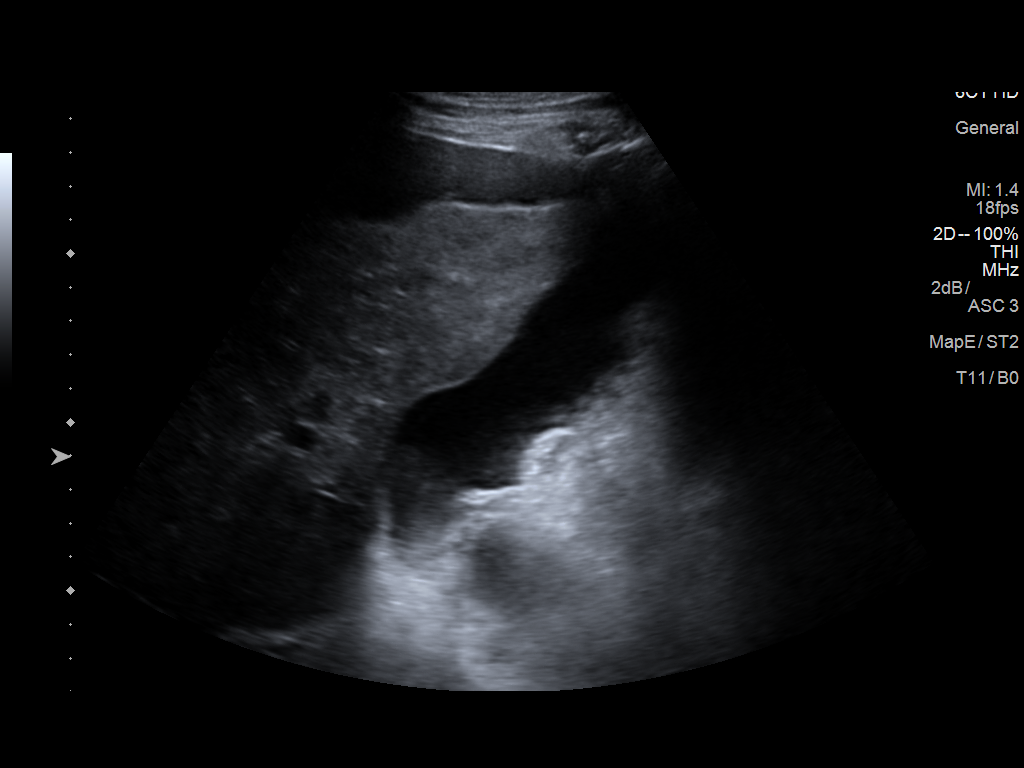
[im 4/4]
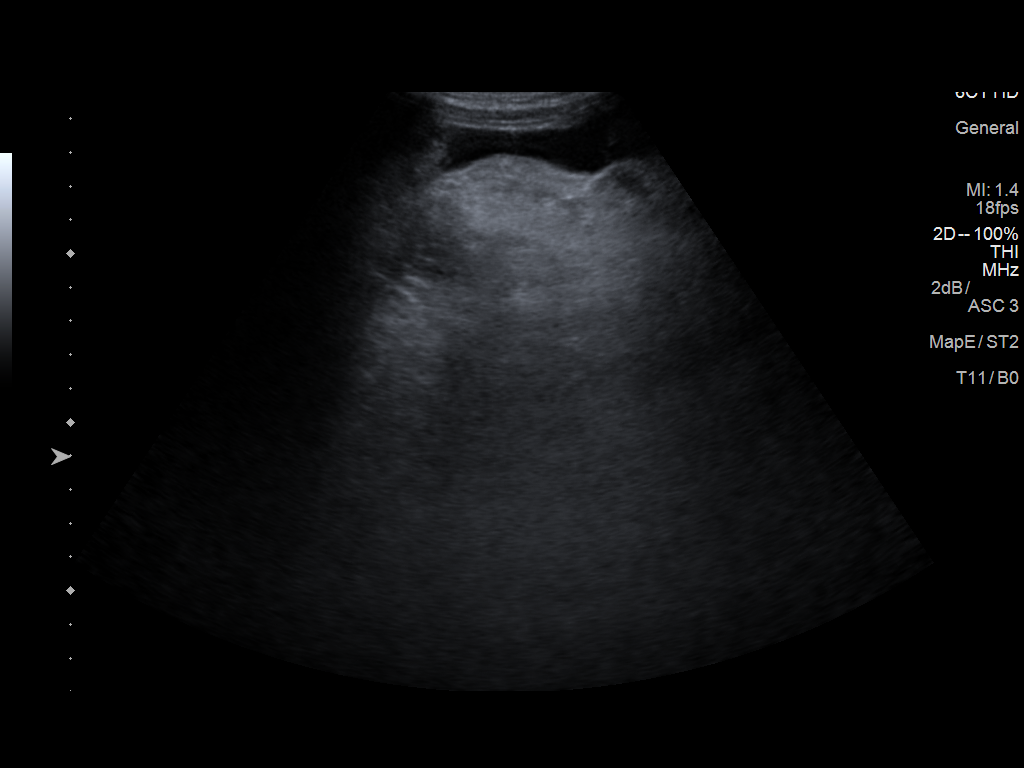

[4 of 4 positions shown; findings below may reference images not displayed]

EXAM:
ULTRASOUND GUIDED RIGHT LATERAL ABDOMEN PARACENTESIS

MEDICATIONS:
1% Lidocaine = 10 mL.

COMPLICATIONS:
None immediate.

PROCEDURE:
Informed written consent was obtained from the patient after a
discussion of the risks, benefits and alternatives to treatment. A
timeout was performed prior to the initiation of the procedure.

Initial ultrasound scanning demonstrates a large amount of ascites
within the right lateral abdomen. The right lateral abdomen was
prepped and draped in the usual sterile fashion. 1% lidocaine with
epinephrine was used for local anesthesia.

Following this, a 19 gauge, 7-cm, Yueh catheter was introduced. An
ultrasound image was saved for documentation purposes. The
paracentesis was performed. The catheter was removed and a dressing
was applied. The patient tolerated the procedure well without
immediate post procedural complication.
FINDINGS: A total of approximately 12 liters of clear dark gold fluid was
removed. Samples were sent to the laboratory as requested by the
clinical team.
IMPRESSION: Successful ultrasound-guided paracentesis yielding 12 liters of
peritoneal fluid.
